# Patient Record
Sex: Female | Born: 1955 | Race: White | Hispanic: No | Marital: Married | Smoking: Former smoker
Health system: Southern US, Community
[De-identification: ages and names within clinical notes are randomized; demographics above are authoritative.]

## PROBLEM LIST (undated history)

## (undated) DIAGNOSIS — R638 Other symptoms and signs concerning food and fluid intake: Secondary | ICD-10-CM

## (undated) DIAGNOSIS — R51 Headache: Secondary | ICD-10-CM

## (undated) DIAGNOSIS — Z780 Asymptomatic menopausal state: Secondary | ICD-10-CM

## (undated) DIAGNOSIS — K5730 Diverticulosis of large intestine without perforation or abscess without bleeding: Secondary | ICD-10-CM

## (undated) DIAGNOSIS — R519 Headache, unspecified: Secondary | ICD-10-CM

## (undated) DIAGNOSIS — Z8669 Personal history of other diseases of the nervous system and sense organs: Secondary | ICD-10-CM

## (undated) DIAGNOSIS — G4700 Insomnia, unspecified: Secondary | ICD-10-CM

## (undated) DIAGNOSIS — N809 Endometriosis, unspecified: Secondary | ICD-10-CM

## (undated) HISTORY — DX: Insomnia, unspecified: G47.00

## (undated) HISTORY — DX: Endometriosis, unspecified: N80.9

## (undated) HISTORY — DX: Diverticulosis of large intestine without perforation or abscess without bleeding: K57.30

## (undated) HISTORY — DX: Headache, unspecified: R51.9

## (undated) HISTORY — DX: Asymptomatic menopausal state: Z78.0

## (undated) HISTORY — DX: Headache: R51

## (undated) HISTORY — DX: Personal history of other diseases of the nervous system and sense organs: Z86.69

## (undated) HISTORY — DX: Other symptoms and signs concerning food and fluid intake: R63.8

---

## 2007-04-14 ENCOUNTER — Encounter: Payer: Self-pay | Admitting: Podiatry

## 2007-04-24 ENCOUNTER — Encounter: Payer: Self-pay | Admitting: Podiatry

## 2007-05-25 ENCOUNTER — Encounter: Payer: Self-pay | Admitting: Podiatry

## 2009-01-12 LAB — HM COLONOSCOPY

## 2011-04-15 ENCOUNTER — Ambulatory Visit: Payer: Self-pay | Admitting: Internal Medicine

## 2011-11-12 ENCOUNTER — Emergency Department: Payer: Self-pay | Admitting: Emergency Medicine

## 2011-11-12 LAB — CBC WITH DIFFERENTIAL/PLATELET
Basophil %: 0.4 %
Eosinophil #: 0.3 10*3/uL (ref 0.0–0.7)
Eosinophil %: 2.4 %
HCT: 42.2 % (ref 35.0–47.0)
HGB: 14 g/dL (ref 12.0–16.0)
Lymphocyte %: 10.9 %
MCV: 89 fL (ref 80–100)
Monocyte #: 1.6 10*3/uL — ABNORMAL HIGH (ref 0.0–0.7)
Neutrophil #: 8.4 10*3/uL — ABNORMAL HIGH (ref 1.4–6.5)
Platelet: 275 10*3/uL (ref 150–440)
RBC: 4.77 10*6/uL (ref 3.80–5.20)
RDW: 13.4 % (ref 11.5–14.5)
WBC: 11.5 10*3/uL — ABNORMAL HIGH (ref 3.6–11.0)

## 2011-11-12 LAB — COMPREHENSIVE METABOLIC PANEL
BUN: 14 mg/dL (ref 7–18)
Bilirubin,Total: 0.3 mg/dL (ref 0.2–1.0)
Calcium, Total: 9.3 mg/dL (ref 8.5–10.1)
Chloride: 102 mmol/L (ref 98–107)
Co2: 23 mmol/L (ref 21–32)
EGFR (African American): >60
EGFR (Non-African Amer.): 57 — ABNORMAL LOW
Glucose: 110 mg/dL — ABNORMAL HIGH (ref 65–99)
Osmolality: 279 (ref 275–301)
Potassium: 3.4 mmol/L — ABNORMAL LOW (ref 3.5–5.1)
SGPT (ALT): 36 U/L

## 2011-11-12 LAB — LIPASE, BLOOD: Lipase: 148 U/L (ref 73–393)

## 2012-09-10 ENCOUNTER — Emergency Department: Payer: Self-pay | Admitting: Emergency Medicine

## 2014-01-18 LAB — HM PAP SMEAR: HM Pap smear: NEGATIVE

## 2014-01-25 LAB — HM MAMMOGRAPHY

## 2014-01-28 ENCOUNTER — Ambulatory Visit: Payer: Self-pay | Admitting: Obstetrics and Gynecology

## 2015-03-21 ENCOUNTER — Ambulatory Visit (INDEPENDENT_AMBULATORY_CARE_PROVIDER_SITE_OTHER): Payer: BC Managed Care – PPO | Admitting: Obstetrics and Gynecology

## 2015-03-21 ENCOUNTER — Ambulatory Visit: Payer: BC Managed Care – PPO | Admitting: Obstetrics and Gynecology

## 2015-03-21 ENCOUNTER — Other Ambulatory Visit: Payer: BC Managed Care – PPO

## 2015-03-21 ENCOUNTER — Encounter: Payer: Self-pay | Admitting: Obstetrics and Gynecology

## 2015-03-21 VITALS — BP 165/78 | HR 81 | Ht 65.0 in | Wt 165.1 lb

## 2015-03-21 DIAGNOSIS — R030 Elevated blood-pressure reading, without diagnosis of hypertension: Secondary | ICD-10-CM | POA: Diagnosis not present

## 2015-03-21 DIAGNOSIS — R102 Pelvic and perineal pain: Secondary | ICD-10-CM | POA: Insufficient documentation

## 2015-03-21 DIAGNOSIS — K5790 Diverticulosis of intestine, part unspecified, without perforation or abscess without bleeding: Secondary | ICD-10-CM | POA: Insufficient documentation

## 2015-03-21 DIAGNOSIS — G43909 Migraine, unspecified, not intractable, without status migrainosus: Secondary | ICD-10-CM | POA: Insufficient documentation

## 2015-03-21 DIAGNOSIS — Z1211 Encounter for screening for malignant neoplasm of colon: Secondary | ICD-10-CM

## 2015-03-21 DIAGNOSIS — N809 Endometriosis, unspecified: Secondary | ICD-10-CM | POA: Diagnosis not present

## 2015-03-21 DIAGNOSIS — G4700 Insomnia, unspecified: Secondary | ICD-10-CM | POA: Insufficient documentation

## 2015-03-21 DIAGNOSIS — Z833 Family history of diabetes mellitus: Secondary | ICD-10-CM

## 2015-03-21 DIAGNOSIS — Z780 Asymptomatic menopausal state: Secondary | ICD-10-CM | POA: Diagnosis not present

## 2015-03-21 DIAGNOSIS — Z1239 Encounter for other screening for malignant neoplasm of breast: Secondary | ICD-10-CM | POA: Diagnosis not present

## 2015-03-21 DIAGNOSIS — R638 Other symptoms and signs concerning food and fluid intake: Secondary | ICD-10-CM | POA: Diagnosis not present

## 2015-03-21 DIAGNOSIS — R1031 Right lower quadrant pain: Secondary | ICD-10-CM | POA: Diagnosis not present

## 2015-03-21 DIAGNOSIS — Z01419 Encounter for gynecological examination (general) (routine) without abnormal findings: Secondary | ICD-10-CM

## 2015-03-21 DIAGNOSIS — E7800 Pure hypercholesterolemia, unspecified: Secondary | ICD-10-CM

## 2015-03-21 DIAGNOSIS — IMO0001 Reserved for inherently not codable concepts without codable children: Secondary | ICD-10-CM

## 2015-03-21 NOTE — Progress Notes (Signed)
Patient ID: Kendra RombergLaura R Farino, female   DOB: 08-Aug-1956, 59 y.o.   MRN: [id number] ANNUAL PREVENTATIVE CARE GYN  ENCOUNTER NOTE  Subjective:       Kendra Adkins is a 59 y.o., white female, married, P2002, postmenopausal, here for a routine annual gynecologic exam.  Current complaints: 1.  Annual visit 2. Right pelvic pain - burning sensation, worsens with movement, increased frequency x 2 weeks PMH for endometriosis, diverticula, and headache. No hormonal supplementation. Negative family history for ovarian cancer.  Has cut sugar out of diet and lowered fat intake - lost 25 lbs in 6 months. Headaches have improved since starting new diet.    Gynecologic History No LMP recorded. Patient is postmenopausal. Contraception: postmenopausal Last Pap: 01/18/2014. Results were: negative/negative hpv Last mammogram: 01/28/2014. Results were: [facility] 1  Obstetric History OB History  No data available    Past Medical History  Diagnosis Date  . Endometriosis   . Head ache   . Diverticula of colon   . Menopause   . Increased BMI   . History of migraine headaches   . Insomnia     History reviewed. No pertinent past surgical history.  Current Outpatient Prescriptions on File Prior to Visit  Medication Sig Dispense Refill  . amitriptyline (ELAVIL) 10 MG tablet Take 10 mg by mouth at bedtime.    . butalbital-acetaminophen-caffeine (FIORICET, ESGIC) 50-325-40 MG per tablet Take 1 tablet by mouth 2 (two) times daily as needed for headache.     No current facility-administered medications on file prior to visit.    Allergies  Allergen Reactions  . Biaxin [Clarithromycin]     blisters    History   Social History  . Marital Status: Married    Spouse Name: N/A  . Number of Children: N/A  . Years of Education: N/A   Occupational History  . Geologist, engineeringteacher assistant     Social History Main Topics  . Smoking status: Former Games developermoker  . Smokeless tobacco: Never Used  . Alcohol Use: 0.0 oz/week    0  Standard drinks or equivalent per week  . Drug Use: No  . Sexual Activity:    Partners: Male   Other Topics Concern  . Not on file   Social History Narrative    History reviewed. No pertinent family history.  The following portions of the patient's history were reviewed and updated as appropriate: allergies, current medications, past family history, past medical history, past social history, past surgical history and problem list.  Review of Systems ROS Review of Systems - General ROS: negative for - chills, fatigue, fever, hot flashes, night sweats, weight gain or weight loss Psychological ROS: negative for - anxiety, decreased libido, depression, mood swings, physical abuse or sexual abuse Ophthalmic ROS: negative for - blurry vision, eye pain or loss of vision ENT ROS: negative for - headaches, hearing change, visual changes or vocal changes Allergy and Immunology ROS: negative for - hives, itchy/watery eyes or seasonal allergies Hematological and Lymphatic ROS: negative for - bleeding problems, bruising, swollen lymph nodes or weight loss Endocrine ROS: negative for - galactorrhea, hair pattern changes, hot flashes, malaise/lethargy, mood swings, palpitations, polydipsia/polyuria, skin changes, temperature intolerance or unexpected weight changes Breast ROS: negative for - new or changing breast lumps or nipple discharge Respiratory ROS: negative for - cough or shortness of breath Cardiovascular ROS: negative for - chest pain, irregular heartbeat, palpitations or shortness of breath Gastrointestinal ROS: no abdominal pain, change in bowel habits, or black or bloody  stools Genito-Urinary ROS: Positive - right pelvic pain, no dysuria, trouble voiding, or hematuria Musculoskeletal ROS: negative for - joint pain or joint stiffness Neurological ROS: negative for - bowel and bladder control changes Dermatological ROS: negative for rash and skin lesion changes   Objective:   BP 165/78  mmHg  Pulse 81  Ht  (1.651 m)  Wt 165 lb 2 oz (74.9 kg)  BMI 27.48 kg/m2 CONSTITUTIONAL: Well-developed, well-nourished female in no acute distress.  PSYCHIATRIC: Normal mood and affect. Normal behavior. Normal judgment and thought content. NEUROLGIC: Alert and oriented to person, place, and time. Normal muscle tone coordination. No cranial nerve deficit noted. HENT:  Normocephalic, atraumatic, External right and left ear normal. Oropharynx is clear and moist EYES: Conjunctivae and EOM are normal. Pupils are equal, round, and reactive to light. No scleral icterus.  NECK: Normal range of motion, supple, no masses.  Normal thyroid.  SKIN: Skin is warm and dry. No rash noted. Not diaphoretic. No erythema. No pallor. CARDIOVASCULAR: Normal heart rate noted, regular rhythm, no murmur. RESPIRATORY: Clear to auscultation bilaterally. Effort and breath sounds normal, no problems with respiration noted. BREASTS: Symmetric in size. No masses, skin changes, nipple drainage, or lymphadenopathy. ABDOMEN: Soft, normal bowel sounds, no distention noted.  No tenderness, rebound or guarding.  BLADDER: Normal PELVIC:  External Genitalia: Normal  BUS: Normal  Vagina: Mild atrophy on vaginal walls  Cervix: Mild atrophic changes  Uterus: Normal  Adnexa: Normal  RV: External Exam NormaI, No Rectal Masses and Normal Sphincter tone  MUSCULOSKELETAL: Normal range of motion. No tenderness.  No cyanosis, clubbing, or edema.  2+ distal pulses. LYMPHATIC: No Axillary, Supraclavicular, or Inguinal Adenopathy.    Assessment:   Annual gynecologic examination 59 y.o. Contraception: postmenopausal BMI: 27  (DOWN from 30 last year) High blood pressure reading today: 165/78  Plan:  Pap: not needed until 2018 Mammogram: ordered Stool Guaiac Testing: ordered Labs: lipid panel, HbgA1c, FBS, TSH, Vitamin D, Renal panel, CBC Routine preventative health maintenance measures emphasized: Exercise/Diet/Weight  control, Tobacco Warnings, Alcohol/Substance use risks and Stress Management  Return to Clinic - 1 Year   Fenton Malling, LPN

## 2015-03-22 ENCOUNTER — Other Ambulatory Visit: Payer: Self-pay | Admitting: Obstetrics and Gynecology

## 2015-03-22 ENCOUNTER — Telehealth: Payer: Self-pay

## 2015-03-22 LAB — CBC WITH DIFFERENTIAL/PLATELET
Basophils Absolute: 0.1 10*3/uL (ref 0.0–0.2)
Basos: 1 %
EOS (ABSOLUTE): 1.1 10*3/uL — AB (ref 0.0–0.4)
Eos: 11 %
HEMATOCRIT: 40.6 % (ref 34.0–46.6)
HEMOGLOBIN: 12.6 g/dL (ref 11.1–15.9)
IMMATURE GRANS (ABS): 0 10*3/uL (ref 0.0–0.1)
Immature Granulocytes: 0 %
LYMPHS: 18 %
Lymphocytes Absolute: 1.9 10*3/uL (ref 0.7–3.1)
MCH: 28.1 pg (ref 26.6–33.0)
MCHC: 31 g/dL — AB (ref 31.5–35.7)
MCV: 91 fL (ref 79–97)
MONOCYTES: 9 %
MONOS ABS: 0.9 10*3/uL (ref 0.1–0.9)
NEUTROS ABS: 6.2 10*3/uL (ref 1.4–7.0)
Neutrophils: 61 %
Platelets: 468 10*3/uL — ABNORMAL HIGH (ref 150–379)
RBC: 4.48 x10E6/uL (ref 3.77–5.28)
RDW: 13.3 % (ref 12.3–15.4)
WBC: 10.1 10*3/uL (ref 3.4–10.8)

## 2015-03-22 LAB — TSH: TSH: 2.11 u[IU]/mL (ref 0.450–4.500)

## 2015-03-22 LAB — HEMOGLOBIN A1C
Est. average glucose Bld gHb Est-mCnc: 117 mg/dL
Hgb A1c MFr Bld: 5.7 % — ABNORMAL HIGH (ref 4.8–5.6)

## 2015-03-22 LAB — LIPID PANEL
CHOLESTEROL TOTAL: 212 mg/dL — AB (ref 100–199)
Chol/HDL Ratio: 3.9 ratio units (ref 0.0–4.4)
HDL: 55 mg/dL (ref >39)
LDL Calculated: 120 mg/dL — ABNORMAL HIGH (ref 0–99)
Triglycerides: 185 mg/dL — ABNORMAL HIGH (ref 0–149)
VLDL Cholesterol Cal: 37 mg/dL (ref 5–40)

## 2015-03-22 LAB — RENAL FUNCTION PANEL: Glucose, Plasma: 93 mg/dL (ref 65–99)

## 2015-03-22 NOTE — Telephone Encounter (Signed)
-----   Message from Herold HarmsMartin A Defrancesco, MD sent at 03/22/2015  2:02 PM EDT ----- Please notify - Abnormal Labs Lipd 1 , HGBA1C elevated. Diet/exercise/repeat in 6 months

## 2015-03-22 NOTE — Telephone Encounter (Signed)
Pt aware. Will place on recall schedule to repeat labs in 6 months.

## 2015-03-23 NOTE — Telephone Encounter (Signed)
DONE

## 2015-03-24 ENCOUNTER — Ambulatory Visit: Payer: BC Managed Care – PPO

## 2015-03-24 DIAGNOSIS — R1031 Right lower quadrant pain: Secondary | ICD-10-CM | POA: Diagnosis not present

## 2015-03-27 LAB — VITAMIN D 1,25 DIHYDROXY
Vitamin D 1, 25 (OH)2 Total: 65 pg/mL
Vitamin D2 1, 25 (OH)2: <10 pg/mL
Vitamin D3 1, 25 (OH)2: 65 pg/mL

## 2015-03-28 ENCOUNTER — Encounter: Payer: Self-pay | Admitting: Obstetrics and Gynecology

## 2015-03-29 LAB — FECAL OCCULT BLOOD, IMMUNOCHEMICAL: Fecal Occult Bld: POSITIVE — AB

## 2015-03-30 ENCOUNTER — Encounter: Payer: Self-pay | Admitting: Nurse Practitioner

## 2015-03-30 ENCOUNTER — Telehealth: Payer: Self-pay

## 2015-03-30 DIAGNOSIS — R195 Other fecal abnormalities: Secondary | ICD-10-CM

## 2015-03-30 NOTE — Telephone Encounter (Signed)
-----   Message from Herold HarmsMartin A Defrancesco, MD sent at 03/29/2015  3:56 PM EDT ----- Please notify - Abnormal Labs Recommend GI referral

## 2015-03-30 NOTE — Telephone Encounter (Signed)
Pt aware. She seems to think the blood is coming from her hemorrhoids. Advised mad would still like her to  See GI to make sure. Referral ordered.

## 2015-04-12 ENCOUNTER — Ambulatory Visit: Payer: Self-pay | Admitting: Nurse Practitioner

## 2015-07-11 ENCOUNTER — Other Ambulatory Visit: Payer: Self-pay

## 2015-07-11 MED ORDER — BUTALBITAL-APAP-CAFFEINE 50-325-40 MG PO TABS: 1.0000 | ORAL_TABLET | Freq: Two times a day (BID) | ORAL | Status: DC | PRN

## 2015-07-11 NOTE — Telephone Encounter (Signed)
Per DC med printed and faxed to [facility] [place].

## 2016-03-21 ENCOUNTER — Encounter: Payer: BC Managed Care – PPO | Admitting: Obstetrics and Gynecology

## 2016-03-27 ENCOUNTER — Encounter: Payer: Self-pay | Admitting: Obstetrics and Gynecology

## 2016-03-27 ENCOUNTER — Ambulatory Visit (INDEPENDENT_AMBULATORY_CARE_PROVIDER_SITE_OTHER): Payer: BC Managed Care – PPO | Admitting: Obstetrics and Gynecology

## 2016-03-27 VITALS — BP 157/86 | HR 69 | Ht 64.0 in | Wt 146.8 lb

## 2016-03-27 DIAGNOSIS — R030 Elevated blood-pressure reading, without diagnosis of hypertension: Secondary | ICD-10-CM

## 2016-03-27 DIAGNOSIS — IMO0001 Reserved for inherently not codable concepts without codable children: Secondary | ICD-10-CM

## 2016-03-27 DIAGNOSIS — Z780 Asymptomatic menopausal state: Secondary | ICD-10-CM | POA: Diagnosis not present

## 2016-03-27 DIAGNOSIS — Z01419 Encounter for gynecological examination (general) (routine) without abnormal findings: Secondary | ICD-10-CM

## 2016-03-27 DIAGNOSIS — Z1239 Encounter for other screening for malignant neoplasm of breast: Secondary | ICD-10-CM

## 2016-03-27 NOTE — Addendum Note (Signed)
Addended by: Sharon SellerEFRANCESCO, MARTIN on: 03/27/2016 03:13 PM   Modules accepted: Kipp BroodSmartSet

## 2016-03-27 NOTE — Patient Instructions (Signed)
1. No Pap smear is needed this year. 2. Mammogram is ordered 3. Stool guaiac cards testing for colon cancer screening are recommended but declined (last colonoscopy 2007-normal) 4. Continue with healthy eating and exercise 5. Screening lab work is done by primary care physician 6. Return in 1 year for physical

## 2016-03-27 NOTE — Progress Notes (Signed)
ANNUAL PREVENTATIVE CARE GYN  ENCOUNTER NOTE  Subjective:       Kendra Adkins is a 60 y.o. [id number] female here for a routine annual gynecologic exam.  Current complaints: 1.  Menopause  No major interval health issues. Patient continues to lose weight Weight [employer]. Bowel and bladder function are normal. Patient is due for colonoscopy but declines at this time; last colonoscopy was in 2007-normal.    Gynecologic History No LMP recorded. Patient is postmenopausal. Contraception: post menopausal status Last Pap: 12/2013 negative/negative Last mammogram: 01/28/2014 BI-RADS 1  Obstetric History OB History  Gravida Para Term Preterm AB SAB TAB Ectopic Multiple Living  2 2 2       2     # Outcome Date GA Lbr Len/2nd Weight Sex Delivery Anes PTL Lv  2 Term 1994   7 lb 8 oz (3.402 kg) M Vag-Spont   Y  1 Term 1991   7 lb 6 oz (3.345 kg) M Vag-Spont   Y      Past Medical History  Diagnosis Date  . Endometriosis   . Head ache   . Diverticula of colon   . Menopause   . Increased BMI   . History of migraine headaches   . Insomnia     History reviewed. No pertinent past surgical history.  Current Outpatient Prescriptions on File Prior to Visit  Medication Sig Dispense Refill  . amitriptyline (ELAVIL) 10 MG tablet Take 10 mg by mouth at bedtime.    . fluticasone (FLONASE) 50 MCG/ACT nasal spray Place into the nose.     No current facility-administered medications on file prior to visit.    Allergies  Allergen Reactions  . Biaxin [Clarithromycin]     blisters    Social History   Social History  . Marital Status: Married    Spouse Name: N/A  . Number of Children: N/A  . Years of Education: N/A   Occupational History  . Geologist, engineeringteacher assistant     Social History Main Topics  . Smoking status: Former Games developermoker  . Smokeless tobacco: Never Used  . Alcohol Use: 0.0 oz/week    0 Standard drinks or equivalent per week     Comment: rare  . Drug Use: No  . Sexual Activity:   Partners: Male    Birth Control/ Protection: Post-menopausal   Other Topics Concern  . Not on file   Social History Narrative    Family History  Problem Relation Age of Onset  . Diabetes Mother   . Congestive Heart Failure Mother   . Diabetes Brother   . Cancer Neg Hx     The following portions of the patient's history were reviewed and updated as appropriate: allergies, current medications, past family history, past medical history, past social history, past surgical history and problem list.  Review of Systems ROS Review of Systems - General ROS: negative for - chills, fatigue, fever, hot flashes, night sweats, weight gain or weight loss Psychological ROS: negative for - anxiety, decreased libido, depression, mood swings, physical abuse or sexual abuse Ophthalmic ROS: negative for - blurry vision, eye pain or loss of vision ENT ROS: negative for - headaches, hearing change, visual changes or vocal changes Allergy and Immunology ROS: negative for - hives, itchy/watery eyes or seasonal allergies Hematological and Lymphatic ROS: negative for - bleeding problems, bruising, swollen lymph nodes or weight loss Endocrine ROS: negative for - galactorrhea, hair pattern changes, hot flashes, malaise/lethargy, mood swings, palpitations, polydipsia/polyuria, skin changes, temperature intolerance  or unexpected weight changes Breast ROS: negative for - new or changing breast lumps or nipple discharge Respiratory ROS: negative for - cough or shortness of breath Cardiovascular ROS: negative for - chest pain, irregular heartbeat, palpitations or shortness of breath Gastrointestinal ROS: no abdominal pain, change in bowel habits, or black or bloody stools Genito-Urinary ROS: no dysuria, trouble voiding, or hematuria Musculoskeletal ROS: negative for - joint pain or joint stiffness Neurological ROS: negative for - bowel and bladder control changes Dermatological ROS: negative for rash and skin  lesion changes   Objective:   BP 157/86 mmHg  Pulse 69  Ht 5\' 4"  (1.626 m)  Wt 146 lb 12.8 oz (66.588 kg)  BMI 25.19 kg/m2 CONSTITUTIONAL: Well-developed, well-nourished female in no acute distress.  PSYCHIATRIC: Normal mood and affect. Normal behavior. Normal judgment and thought content. NEUROLGIC: Alert and oriented to person, place, and time. Normal muscle tone coordination. No cranial nerve deficit noted. HENT:  Normocephalic, atraumatic, External right and left ear normal. Oropharynx is clear and moist EYES: Conjunctivae and EOM are normal. Pupils are equal, round, and reactive to light. No scleral icterus.  NECK: Normal range of motion, supple, no masses.  Normal thyroid.  SKIN: Skin is warm and dry. No rash noted. Not diaphoretic. No erythema. No pallor. CARDIOVASCULAR: Normal heart rate noted, regular rhythm, no murmur. RESPIRATORY: Clear to auscultation bilaterally. Effort and breath sounds normal, no problems with respiration noted. BREASTS: Symmetric in size. No masses, skin changes, nipple drainage, or lymphadenopathy. ABDOMEN: Soft, normal bowel sounds, no distention noted.  No tenderness, rebound or guarding.  BLADDER: Normal PELVIC:  External Genitalia: Normal  BUS: Normal  Vagina: Normal; mild atrophic changes  Cervix: Normal; no lesions  Uterus: Normal; midplane, normal size and shape, mobile, nontender  Adnexa: Normal  RV: External Exam NormaI, No Rectal Masses and Normal Sphincter tone  MUSCULOSKELETAL: Normal range of motion. No tenderness.  No cyanosis, clubbing, or edema.  2+ distal pulses. LYMPHATIC: No Axillary, Supraclavicular, or Inguinal Adenopathy.    Assessment:   Annual gynecologic examination 60 y.o. Contraception: post menopausal status Normal BMI Problem List Items Addressed This Visit      Other   Menopause    Other Visit Diagnoses    Well woman exam with routine gynecological exam    -  Primary    Screening for breast cancer         Relevant Orders    MM DIGITAL SCREENING BILATERAL    Elevated blood pressure           Plan:  Pap: Not needed and Not done Mammogram: Ordered Stool Guaiac Testing:  Recommended but declined colonoscopy 2007-normal Labs: Per primary care Routine preventative health maintenance measures emphasized: Exercise/Diet/Weight control, Tobacco Warnings and Alcohol/Substance use risks Return to Clinic - 1 Year   Herold HarmsMartin A Shaundra Fullam, MD   Note: This dictation was prepared with Dragon dictation along with smaller phrase technology. Any transcriptional errors that result from this process are unintentional.

## 2016-04-24 ENCOUNTER — Other Ambulatory Visit: Payer: Self-pay | Admitting: Obstetrics and Gynecology

## 2016-04-24 ENCOUNTER — Ambulatory Visit
Admission: RE | Admit: 2016-04-24 | Discharge: 2016-04-24 | Disposition: A | Discharge status: Home / Self Care | Payer: BC Managed Care – PPO | Source: Ambulatory Visit | Attending: Obstetrics and Gynecology | Admitting: Obstetrics and Gynecology

## 2016-04-24 DIAGNOSIS — Z1239 Encounter for other screening for malignant neoplasm of breast: Secondary | ICD-10-CM | POA: Insufficient documentation

## 2016-04-24 DIAGNOSIS — Z1231 Encounter for screening mammogram for malignant neoplasm of breast: Secondary | ICD-10-CM | POA: Insufficient documentation

## 2017-03-25 NOTE — Progress Notes (Signed)
ANNUAL PREVENTATIVE CARE GYN  ENCOUNTER NOTE  Subjective:       Kendra Adkins is a 61 y.o. [phone] female here for a routine annual gynecologic exam.  Current complaints: 1.  Menopause  No major interval health issues. Bowel and bladder function are normal. Patient is due for colonoscopy, last colonoscopy was in 2007-normal.    Gynecologic History No LMP recorded. Patient is postmenopausal. Contraception: post menopausal status Last Pap: 12/2013 negative/negative Last mammogram:04/2016  BI-RADS 1  Obstetric History OB History  Gravida Para Term Preterm AB Living  2 2 2     2   SAB TAB Ectopic Multiple Live Births          2    # Outcome Date GA Lbr Len/2nd Weight Sex Delivery Anes PTL Lv  2 Term 1994   7 lb 8 oz (3.402 kg) M Vag-Spont   LIV  1 Term 1991   7 lb 6 oz (3.345 kg) M Vag-Spont   LIV     Past Medical History:  Diagnosis Date  . Diverticula of colon   . Endometriosis   . Head ache   . History of migraine headaches   . Increased BMI   . Insomnia   . Menopause     No past surgical history on file.  Current Outpatient Prescriptions on File Prior to Visit  Medication Sig Dispense Refill  . amitriptyline (ELAVIL) 10 MG tablet Take 10 mg by mouth at bedtime.    . butalbital-aspirin-caffeine (FIORINAL) 50-325-40 MG capsule     . fluticasone (FLONASE) 50 MCG/ACT nasal spray Place into the nose.     No current facility-administered medications on file prior to visit.     Allergies  Allergen Reactions  . Biaxin [Clarithromycin]     blisters    Social History   Social History  . Marital status: Married    Spouse name: N/A  . Number of children: N/A  . Years of education: N/A   Occupational History  . Geologist, engineeringteacher assistant     Social History Main Topics  . Smoking status: Former Games developermoker  . Smokeless tobacco: Never Used  . Alcohol use 0.0 oz/week     Comment: rare  . Drug use: No  . Sexual activity: Yes    Partners: Male    Birth control/ protection:  Post-menopausal   Other Topics Concern  . Not on file   Social History Narrative  . No narrative on file    Family History  Problem Relation Age of Onset  . Diabetes Mother   . Congestive Heart Failure Mother   . Diabetes Brother   . Breast cancer Cousin        mat cousin  . Cancer Neg Hx     The following portions of the patient's history were reviewed and updated as appropriate: allergies, current medications, past family history, past medical history, past social history, past surgical history and problem list.  Review of Systems Review of Systems  Constitutional: Negative.        No vasomotor symptoms  HENT: Negative.   Eyes: Negative.   Respiratory: Negative.   Cardiovascular: Negative.   Gastrointestinal: Negative.   Genitourinary: Negative.        Mild vaginal dryness, not affecting lifestyle  Musculoskeletal: Negative.   Skin: Negative.   Neurological: Negative.   Endo/Heme/Allergies: Negative.   Psychiatric/Behavioral: Negative.      Objective:   BP (!) 168/87   Pulse 91   Ht 5' 4.5" (1.638  m)   Wt 151 lb 11.2 oz (68.8 kg)   BMI 25.64 kg/m  CONSTITUTIONAL: Well-developed, well-nourished female in no acute distress.  PSYCHIATRIC: Normal mood and affect. Normal behavior. Normal judgment and thought content. NEUROLGIC: Alert and oriented to person, place, and time. Normal muscle tone coordination. No cranial nerve deficit noted. HENT:  Normocephalic, atraumatic, External right and left ear normal. Oropharynx is clear and moist EYES: Conjunctivae and EOM are normal. Pupils are equal, round, and reactive to light. No scleral icterus.  NECK: Normal range of motion, supple, no masses.  Normal thyroid.  SKIN: Skin is warm and dry. No rash noted. Not diaphoretic. No erythema. No pallor. CARDIOVASCULAR: Normal heart rate noted, regular rhythm, no murmur. RESPIRATORY: Clear to auscultation bilaterally. Effort and breath sounds normal, no problems with respiration  noted. BREASTS: Symmetric in size. No masses, skin changes, nipple drainage, or lymphadenopathy. ABDOMEN: Soft, normal bowel sounds, no distention noted.  No tenderness, rebound or guarding.  BLADDER: Normal PELVIC:  External Genitalia: Normal  BUS: Normal  Vagina: Normal; moderate atrophic changes  Cervix: Normal; no lesions; Pap smear obtained  Uterus: Normal; midplane, normal size and shape, mobile, nontender  Adnexa: Normal  RV: External Exam NormaI, No Rectal Masses and Normal Sphincter tone  MUSCULOSKELETAL: Normal range of motion. No tenderness.  No cyanosis, clubbing, or edema.  2+ distal pulses. LYMPHATIC: No Axillary, Supraclavicular, or Inguinal Adenopathy.    Assessment:   Annual gynecologic examination 61 y.o. Contraception: post menopausal status Normal BMI Problem List Items Addressed This Visit    Menopause    Other Visit Diagnoses    Well woman exam with routine gynecological exam    -  Primary   Screening for breast cancer        Elevated blood pressure  Plan:  Pap: pap/hpv Mammogram: Ordered Stool Guaiac Testing:  Recommended but declined colonoscopy 2007-normal Labs: Per primary care Routine preventative health maintenance measures emphasized: Exercise/Diet/Weight control, Tobacco Warnings and Alcohol/Substance use risks  Follow-up with Dr. Maryjane Hurter regarding elevated blood pressure Return to Clinic - 1 Year   Crystal Roscoe, CMA  Herold Harms, MD  Note: This dictation was prepared with Dragon dictation along with smaller phrase technology. Any transcriptional errors that result from this process are unintentional.

## 2017-04-01 ENCOUNTER — Encounter: Payer: Self-pay | Admitting: Obstetrics and Gynecology

## 2017-04-01 ENCOUNTER — Ambulatory Visit (INDEPENDENT_AMBULATORY_CARE_PROVIDER_SITE_OTHER): Payer: BC Managed Care – PPO | Admitting: Obstetrics and Gynecology

## 2017-04-01 ENCOUNTER — Encounter: Payer: BC Managed Care – PPO | Admitting: Obstetrics and Gynecology

## 2017-04-01 VITALS — BP 168/87 | HR 91 | Ht 64.5 in | Wt 151.7 lb

## 2017-04-01 DIAGNOSIS — R030 Elevated blood-pressure reading, without diagnosis of hypertension: Secondary | ICD-10-CM | POA: Diagnosis not present

## 2017-04-01 DIAGNOSIS — Z780 Asymptomatic menopausal state: Secondary | ICD-10-CM | POA: Diagnosis not present

## 2017-04-01 DIAGNOSIS — Z1211 Encounter for screening for malignant neoplasm of colon: Secondary | ICD-10-CM

## 2017-04-01 DIAGNOSIS — Z01419 Encounter for gynecological examination (general) (routine) without abnormal findings: Secondary | ICD-10-CM

## 2017-04-01 DIAGNOSIS — Z1231 Encounter for screening mammogram for malignant neoplasm of breast: Secondary | ICD-10-CM | POA: Diagnosis not present

## 2017-04-01 DIAGNOSIS — N809 Endometriosis, unspecified: Secondary | ICD-10-CM | POA: Diagnosis not present

## 2017-04-01 DIAGNOSIS — Z1239 Encounter for other screening for malignant neoplasm of breast: Secondary | ICD-10-CM

## 2017-04-01 NOTE — Patient Instructions (Signed)
1. Pap smear is done 2. Mammograms ordered 3. Stool guaiac cards are recommended, but declined by patient 4. Continue with calcium and vitamin D supplementation 5. Continue with healthy eating and exercise 6. Return in 1 year for annual exam 7. Follow-up with Dr. Ellison Kendra Adkins regarding elevated blood pressure   Health Maintenance for Postmenopausal Women Menopause is a normal process in which your reproductive ability comes to an end. This process happens gradually over a span of months to years, usually between the ages of 43 and 16. Menopause is complete when you have missed 12 consecutive menstrual periods. It is important to talk with your health care provider about some of the most common conditions that affect postmenopausal women, such as heart disease, cancer, and bone loss (osteoporosis). Adopting a healthy lifestyle and getting preventive care can help to promote your health and wellness. Those actions can also lower your chances of developing some of these common conditions. What should I know about menopause? During menopause, you may experience a number of symptoms, such as:  Moderate-to-severe hot flashes.  Night sweats.  Decrease in sex drive.  Mood swings.  Headaches.  Tiredness.  Irritability.  Memory problems.  Insomnia.  Choosing to treat or not to treat menopausal changes is an individual decision that you make with your health care provider. What should I know about hormone replacement therapy and supplements? Hormone therapy products are effective for treating symptoms that are associated with menopause, such as hot flashes and night sweats. Hormone replacement carries certain risks, especially as you become older. If you are thinking about using estrogen or estrogen with progestin treatments, discuss the benefits and risks with your health care provider. What should I know about heart disease and stroke? Heart disease, heart attack, and stroke become more  likely as you age. This may be due, in part, to the hormonal changes that your body experiences during menopause. These can affect how your body processes dietary fats, triglycerides, and cholesterol. Heart attack and stroke are both medical emergencies. There are many things that you can do to help prevent heart disease and stroke:  Have your blood pressure checked at least every 1-2 years. High blood pressure causes heart disease and increases the risk of stroke.  If you are 68-3 years old, ask your health care provider if you should take aspirin to prevent a heart attack or a stroke.  Do not use any tobacco products, including cigarettes, chewing tobacco, or electronic cigarettes. If you need help quitting, ask your health care provider.  It is important to eat a healthy diet and maintain a healthy weight. ? Be sure to include plenty of vegetables, fruits, low-fat dairy products, and lean protein. ? Avoid eating foods that are high in solid fats, added sugars, or salt (sodium).  Get regular exercise. This is one of the most important things that you can do for your health. ? Try to exercise for at least 150 minutes each week. The type of exercise that you do should increase your heart rate and make you sweat. This is known as moderate-intensity exercise. ? Try to do strengthening exercises at least twice each week. Do these in addition to the moderate-intensity exercise.  Know your numbers.Ask your health care provider to check your cholesterol and your blood glucose. Continue to have your blood tested as directed by your health care provider.  What should I know about cancer screening? There are several types of cancer. Take the following steps to reduce your risk and  to catch any cancer development as early as possible. Breast Cancer  Practice breast self-awareness. ? This means understanding how your breasts normally appear and feel. ? It also means doing regular breast self-exams.  Let your health care provider know about any changes, no matter how small.  If you are 59 or older, have a clinician do a breast exam (clinical breast exam or CBE) every year. Depending on your age, family history, and medical history, it may be recommended that you also have a yearly breast X-ray (mammogram).  If you have a family history of breast cancer, talk with your health care provider about genetic screening.  If you are at high risk for breast cancer, talk with your health care provider about having an MRI and a mammogram every year.  Breast cancer (BRCA) gene test is recommended for women who have family members with BRCA-related cancers. Results of the assessment will determine the need for genetic counseling and BRCA1 and for BRCA2 testing. BRCA-related cancers include these types: ? Breast. This occurs in males or females. ? Ovarian. ? Tubal. This may also be called fallopian tube cancer. ? Cancer of the abdominal or pelvic lining (peritoneal cancer). ? Prostate. ? Pancreatic.  Cervical, Uterine, and Ovarian Cancer Your health care provider may recommend that you be screened regularly for cancer of the pelvic organs. These include your ovaries, uterus, and vagina. This screening involves a pelvic exam, which includes checking for microscopic changes to the surface of your cervix (Pap test).  For women ages 21-65, health care providers may recommend a pelvic exam and a Pap test every three years. For women ages 46-65, they may recommend the Pap test and pelvic exam, combined with testing for human papilloma virus (HPV), every five years. Some types of HPV increase your risk of cervical cancer. Testing for HPV may also be done on women of any age who have unclear Pap test results.  Other health care providers may not recommend any screening for nonpregnant women who are considered low risk for pelvic cancer and have no symptoms. Ask your health care provider if a screening pelvic exam  is right for you.  If you have had past treatment for cervical cancer or a condition that could lead to cancer, you need Pap tests and screening for cancer for at least 20 years after your treatment. If Pap tests have been discontinued for you, your risk factors (such as having a new sexual partner) need to be reassessed to determine if you should start having screenings again. Some women have medical problems that increase the chance of getting cervical cancer. In these cases, your health care provider may recommend that you have screening and Pap tests more often.  If you have a family history of uterine cancer or ovarian cancer, talk with your health care provider about genetic screening.  If you have vaginal bleeding after reaching menopause, tell your health care provider.  There are currently no reliable tests available to screen for ovarian cancer.  Lung Cancer Lung cancer screening is recommended for adults 28-55 years old who are at high risk for lung cancer because of a history of smoking. A yearly low-dose CT scan of the lungs is recommended if you:  Currently smoke.  Have a history of at least 30 pack-years of smoking and you currently smoke or have quit within the past 15 years. A pack-year is smoking an average of one pack of cigarettes per day for one year.  Yearly screening should:  Continue until it has been 15 years since you quit.  Stop if you develop a health problem that would prevent you from having lung cancer treatment.  Colorectal Cancer  This type of cancer can be detected and can often be prevented.  Routine colorectal cancer screening usually begins at age 73 and continues through age 33.  If you have risk factors for colon cancer, your health care provider may recommend that you be screened at an earlier age.  If you have a family history of colorectal cancer, talk with your health care provider about genetic screening.  Your health care provider may also  recommend using home test kits to check for hidden blood in your stool.  A small camera at the end of a tube can be used to examine your colon directly (sigmoidoscopy or colonoscopy). This is done to check for the earliest forms of colorectal cancer.  Direct examination of the colon should be repeated every 5-10 years until age 57. However, if early forms of precancerous polyps or small growths are found or if you have a family history or genetic risk for colorectal cancer, you may need to be screened more often.  Skin Cancer  Check your skin from head to toe regularly.  Monitor any moles. Be sure to tell your health care provider: ? About any new moles or changes in moles, especially if there is a change in a mole's shape or color. ? If you have a mole that is larger than the size of a pencil eraser.  If any of your family members has a history of skin cancer, especially at a young age, talk with your health care provider about genetic screening.  Always use sunscreen. Apply sunscreen liberally and repeatedly throughout the day.  Whenever you are outside, protect yourself by wearing long sleeves, pants, a wide-brimmed hat, and sunglasses.  What should I know about osteoporosis? Osteoporosis is a condition in which bone destruction happens more quickly than new bone creation. After menopause, you may be at an increased risk for osteoporosis. To help prevent osteoporosis or the bone fractures that can happen because of osteoporosis, the following is recommended:  If you are 21-2 years old, get at least 1,000 mg of calcium and at least 600 mg of vitamin D per day.  If you are older than age 88 but younger than age 6, get at least 1,200 mg of calcium and at least 600 mg of vitamin D per day.  If you are older than age 34, get at least 1,200 mg of calcium and at least 800 mg of vitamin D per day.  Smoking and excessive alcohol intake increase the risk of osteoporosis. Eat foods that are  rich in calcium and vitamin D, and do weight-bearing exercises several times each week as directed by your health care provider. What should I know about how menopause affects my mental health? Depression may occur at any age, but it is more common as you become older. Common symptoms of depression include:  Low or sad mood.  Changes in sleep patterns.  Changes in appetite or eating patterns.  Feeling an overall lack of motivation or enjoyment of activities that you previously enjoyed.  Frequent crying spells.  Talk with your health care provider if you think that you are experiencing depression. What should I know about immunizations? It is important that you get and maintain your immunizations. These include:  Tetanus, diphtheria, and pertussis (Tdap) booster vaccine.  Influenza every year before the  flu season begins.  Pneumonia vaccine.  Shingles vaccine.  Your health care provider may also recommend other immunizations. This information is not intended to replace advice given to you by your health care provider. Make sure you discuss any questions you have with your health care provider. Document Released: 11/01/2005 Document Revised: 03/29/2016 Document Reviewed: 06/13/2015 Elsevier Interactive Patient Education  2018 [employer].

## 2017-04-03 LAB — PAP IG AND HPV HIGH-RISK
HPV, HIGH-RISK: NEGATIVE
PAP SMEAR COMMENT: 0

## 2018-04-02 ENCOUNTER — Encounter: Payer: BC Managed Care – PPO | Admitting: Obstetrics and Gynecology

## 2018-06-01 ENCOUNTER — Emergency Department: Payer: BC Managed Care – PPO

## 2018-06-01 ENCOUNTER — Other Ambulatory Visit: Payer: Self-pay

## 2018-06-01 ENCOUNTER — Inpatient Hospital Stay
Admission: EM | Admit: 2018-06-01 | Discharge: 2018-06-05 | DRG: 418 | Disposition: A | Discharge status: Home / Self Care | Payer: BC Managed Care – PPO | Attending: Internal Medicine | Admitting: Internal Medicine

## 2018-06-01 ENCOUNTER — Inpatient Hospital Stay: Payer: BC Managed Care – PPO

## 2018-06-01 DIAGNOSIS — K8012 Calculus of gallbladder with acute and chronic cholecystitis without obstruction: Secondary | ICD-10-CM | POA: Diagnosis present

## 2018-06-01 DIAGNOSIS — F329 Major depressive disorder, single episode, unspecified: Secondary | ICD-10-CM | POA: Diagnosis present

## 2018-06-01 DIAGNOSIS — Z87891 Personal history of nicotine dependence: Secondary | ICD-10-CM

## 2018-06-01 DIAGNOSIS — E663 Overweight: Secondary | ICD-10-CM | POA: Diagnosis present

## 2018-06-01 DIAGNOSIS — K851 Biliary acute pancreatitis without necrosis or infection: Principal | ICD-10-CM

## 2018-06-01 DIAGNOSIS — R740 Nonspecific elevation of levels of transaminase and lactic acid dehydrogenase [LDH]: Secondary | ICD-10-CM

## 2018-06-01 DIAGNOSIS — Z419 Encounter for procedure for purposes other than remedying health state, unspecified: Secondary | ICD-10-CM

## 2018-06-01 DIAGNOSIS — K8050 Calculus of bile duct without cholangitis or cholecystitis without obstruction: Secondary | ICD-10-CM

## 2018-06-01 DIAGNOSIS — Z23 Encounter for immunization: Secondary | ICD-10-CM

## 2018-06-01 DIAGNOSIS — I10 Essential (primary) hypertension: Secondary | ICD-10-CM | POA: Diagnosis present

## 2018-06-01 DIAGNOSIS — K8000 Calculus of gallbladder with acute cholecystitis without obstruction: Secondary | ICD-10-CM | POA: Diagnosis not present

## 2018-06-01 DIAGNOSIS — R7401 Elevation of levels of liver transaminase levels: Secondary | ICD-10-CM | POA: Diagnosis present

## 2018-06-01 DIAGNOSIS — Z6827 Body mass index (BMI) 27.0-27.9, adult: Secondary | ICD-10-CM | POA: Diagnosis not present

## 2018-06-01 DIAGNOSIS — R079 Chest pain, unspecified: Secondary | ICD-10-CM | POA: Diagnosis present

## 2018-06-01 DIAGNOSIS — R52 Pain, unspecified: Secondary | ICD-10-CM

## 2018-06-01 DIAGNOSIS — E876 Hypokalemia: Secondary | ICD-10-CM | POA: Diagnosis not present

## 2018-06-01 DIAGNOSIS — G43909 Migraine, unspecified, not intractable, without status migrainosus: Secondary | ICD-10-CM | POA: Diagnosis present

## 2018-06-01 LAB — HEPATIC FUNCTION PANEL
ALBUMIN: 3.7 g/dL (ref 3.5–5.0)
ALK PHOS: 269 U/L — AB (ref 38–126)
ALT: 479 U/L — ABNORMAL HIGH (ref 0–44)
ALT: 592 U/L — ABNORMAL HIGH (ref 0–44)
AST: 835 U/L — AB (ref 15–41)
AST: 701 U/L — ABNORMAL HIGH (ref 15–41)
Albumin: 4 g/dL (ref 3.5–5.0)
Alkaline Phosphatase: 269 U/L — ABNORMAL HIGH (ref 38–126)
BILIRUBIN TOTAL: 1.8 mg/dL — AB (ref 0.3–1.2)
Bilirubin, Direct: 1.1 mg/dL — ABNORMAL HIGH (ref 0.0–0.2)
Bilirubin, Direct: 1.8 mg/dL — ABNORMAL HIGH (ref 0.0–0.2)
Indirect Bilirubin: 0.7 mg/dL (ref 0.3–0.9)
Indirect Bilirubin: 1.2 mg/dL — ABNORMAL HIGH (ref 0.3–0.9)
Total Bilirubin: 3 mg/dL — ABNORMAL HIGH (ref 0.3–1.2)
Total Protein: 7.2 g/dL (ref 6.5–8.1)
Total Protein: 6.8 g/dL (ref 6.5–8.1)

## 2018-06-01 LAB — BASIC METABOLIC PANEL
ANION GAP: 8 (ref 5–15)
BUN: 16 mg/dL (ref 8–23)
CHLORIDE: 105 mmol/L (ref 98–111)
CO2: 26 mmol/L (ref 22–32)
Calcium: 9.6 mg/dL (ref 8.9–10.3)
Creatinine, Ser: 0.99 mg/dL (ref 0.44–1.00)
GFR calc non Af Amer: 59 mL/min — ABNORMAL LOW (ref >60)
Glucose, Bld: 132 mg/dL — ABNORMAL HIGH (ref 70–99)
POTASSIUM: 4.2 mmol/L (ref 3.5–5.1)
SODIUM: 139 mmol/L (ref 135–145)

## 2018-06-01 LAB — TSH: TSH: 2.248 u[IU]/mL (ref 0.350–4.500)

## 2018-06-01 LAB — CBC
HEMATOCRIT: 35.2 % (ref 35.0–47.0)
Hemoglobin: 11.7 g/dL — ABNORMAL LOW (ref 12.0–16.0)
MCH: 27.8 pg (ref 26.0–34.0)
MCHC: 33.1 g/dL (ref 32.0–36.0)
MCV: 84.1 fL (ref 80.0–100.0)
Platelets: 373 10*3/uL (ref 150–440)
RBC: 4.19 MIL/uL (ref 3.80–5.20)
RDW: 15.5 % — ABNORMAL HIGH (ref 11.5–14.5)
WBC: 7.4 10*3/uL (ref 3.6–11.0)

## 2018-06-01 LAB — TROPONIN I: Troponin I: <0.03 ng/mL (ref <0.03)

## 2018-06-01 LAB — LIPASE, BLOOD: Lipase: 75 U/L — ABNORMAL HIGH (ref 11–51)

## 2018-06-01 LAB — GAMMA GT: GGT: 371 U/L — ABNORMAL HIGH (ref 7–50)

## 2018-06-01 MED ORDER — PANTOPRAZOLE SODIUM 40 MG PO TBEC
40.0000 mg | DELAYED_RELEASE_TABLET | Freq: Every day | ORAL | Status: DC
  Administered 2018-06-01 – 2018-06-05 (×5): 40 mg via ORAL
  Filled 2018-06-01 (×5): qty 1

## 2018-06-01 MED ORDER — LISINOPRIL 10 MG PO TABS
10.0000 mg | ORAL_TABLET | Freq: Every day | ORAL | Status: DC
  Administered 2018-06-02 – 2018-06-05 (×4): 10 mg via ORAL
  Filled 2018-06-01 (×6): qty 1

## 2018-06-01 MED ORDER — ONDANSETRON HCL 4 MG PO TABS
4.0000 mg | ORAL_TABLET | Freq: Four times a day (QID) | ORAL | Status: DC | PRN
  Administered 2018-06-02 – 2018-06-05 (×3): 4 mg via ORAL
  Filled 2018-06-01 (×3): qty 1

## 2018-06-01 MED ORDER — ONDANSETRON HCL 4 MG/2ML IJ SOLN
4.0000 mg | Freq: Four times a day (QID) | INTRAMUSCULAR | Status: DC | PRN
  Administered 2018-06-04: 4 mg via INTRAVENOUS
  Filled 2018-06-01: qty 2

## 2018-06-01 MED ORDER — SODIUM CHLORIDE 0.9 % IV SOLN
INTRAVENOUS | Status: DC
  Administered 2018-06-01 – 2018-06-03 (×7): via INTRAVENOUS

## 2018-06-01 MED ORDER — DOCUSATE SODIUM 100 MG PO CAPS
100.0000 mg | ORAL_CAPSULE | Freq: Two times a day (BID) | ORAL | Status: DC
  Administered 2018-06-01 – 2018-06-05 (×6): 100 mg via ORAL
  Filled 2018-06-01 (×8): qty 1

## 2018-06-01 MED ORDER — AMITRIPTYLINE HCL 10 MG PO TABS
10.0000 mg | ORAL_TABLET | Freq: Every day | ORAL | Status: DC
  Administered 2018-06-01 – 2018-06-04 (×4): 10 mg via ORAL
  Filled 2018-06-01 (×5): qty 1

## 2018-06-01 MED ORDER — CALCIUM CARBONATE ANTACID 500 MG PO CHEW
1.0000 | CHEWABLE_TABLET | Freq: Four times a day (QID) | ORAL | Status: DC | PRN
  Administered 2018-06-03: 200 mg via ORAL
  Filled 2018-06-01: qty 1

## 2018-06-01 MED ORDER — ACETAMINOPHEN 650 MG RE SUPP: 650.0000 mg | Freq: Four times a day (QID) | RECTAL | Status: DC | PRN

## 2018-06-01 MED ORDER — ACETAMINOPHEN 325 MG PO TABS
650.0000 mg | ORAL_TABLET | Freq: Four times a day (QID) | ORAL | Status: DC | PRN
  Administered 2018-06-02 (×2): 650 mg via ORAL
  Filled 2018-06-01 (×2): qty 2

## 2018-06-01 MED ORDER — MORPHINE SULFATE (PF) 2 MG/ML IV SOLN: 2.0000 mg | INTRAVENOUS | Status: DC | PRN

## 2018-06-01 NOTE — ED Notes (Signed)
ED Provider at bedside. 

## 2018-06-01 NOTE — ED Notes (Signed)
Admitting md at bedside

## 2018-06-01 NOTE — Progress Notes (Signed)
   06/01/18 1100  Clinical Encounter Type  Visited With Patient and family together  Visit Type Initial (Advance Directives Education)  Referral From Physician  Recommendations Follow-up if requested.  Advance Directives (For Healthcare)  Does Patient Have a Medical Advance Directive? No (Advance Directives booklet left with patient.)   Patient was noncommittal about receiving AD education. Her husband preferred leaving the AD booklet and letting them review the material on their own. Chaplain reminded the couple of the sections and how to complete the AD off-campus.  Chaplain closed the OR, but will return to complete the education and AD if requested by the patient.

## 2018-06-01 NOTE — Consult Note (Signed)
Kendra Darby, MD [address]  [place]  [place], [place] [address]  Main: [phone]  Fax: [phone] Pager: [phone]   Consultation  Referring Provider:     No ref. provider found Primary Care Physician:  Kendra Hartigan, MD Primary Gastroenterologist:  Dr. Sherri Sear         Reason for Consultation:     Gallstone pancreatitis, dilated bile ducts  Date of Admission:  06/01/2018 Date of Consultation:  06/01/2018         HPI:   Kendra Adkins is a 62 y.o. Caucasian female with hypertension admitted with 2 days history of acute epigastric pain associated with nausea and vomiting that started after eating hot dog and was found to have gallstone pancreatitis, acute cholecystitis. She reports having similar self-limited, less severe episode a few months ago. On arrival, to the ER, she was found to have elevated LFTs, alkaline phosphatase 269, AST/ALT 835/479, total bilirubin 1.8, direct bilirubin 1.1.Lipase was 75. No evidence of leukocytosis. Patient underwent ultrasound abdomen which revealed gallstones and gallbladder wall thickening and mild intrahepatic biliary ductal dilation. Normal CBD. Patient is kept nothing by mouth and GI is consulted to evaluate for possible ERCP. When I saw the patient, she denied any epigastric pain, nausea and vomiting. She reports that her symptoms subsided 13 hours ago. She wants to try to eat something. She denies fever, chills.  Her husband is bedside She drinks alcohol occasionally, denies smoking  NSAIDs: none  Antiplts/Anticoagulants/Anti thrombotics: none  GI Procedures: unknown  Past Medical History:  Diagnosis Date  . Diverticula of colon   . Endometriosis   . Head ache   . History of migraine headaches   . Increased BMI   . Insomnia   . Menopause     History reviewed. No pertinent surgical history.  Prior to Admission medications   Medication Sig Start Date End Date Taking? Authorizing Provider  amitriptyline  (ELAVIL) 10 MG tablet Take 10 mg by mouth at bedtime.   Yes [provider]  lisinopril (PRINIVIL,ZESTRIL) 10 MG tablet Take 10 mg by mouth daily. 04/19/18  Yes [provider]    Family History  Problem Relation Age of Onset  . Diabetes Mother   . Congestive Heart Failure Mother   . Diabetes Brother   . Breast cancer Cousin        mat cousin  . Cancer Neg Hx      Social History   Tobacco Use  . Smoking status: Former Research scientist (life sciences)  . Smokeless tobacco: Never Used  Substance Use Topics  . Alcohol use: Yes    Alcohol/week: 0.0 standard drinks    Comment: occas  . Drug use: No    Allergies as of 06/01/2018 - Review Complete 06/01/2018  Allergen Reaction Noted  . Biaxin [clarithromycin] Other (See Comments) 01/13/2015    Review of Systems:    All systems reviewed and negative except where noted in HPI.   Physical Exam:  Vital signs in last 24 hours: Temp:  [98.2 F (36.8 C)] 98.2 F (36.8 C) (09/09 1359) Pulse Rate:  [79-99] 86 (09/09 1359) Resp:  [7-23] 16 (09/09 1359) BP: (136-163)/(72-87) 144/82 (09/09 1359) SpO2:  [97 %-100 %] 98 % (09/09 1359) Weight:  [72.6 kg] 72.6 kg (09/09 0147) Last BM Date: 05/31/18 General:   Pleasant, cooperative in NAD Head:  Normocephalic and atraumatic. Eyes:   No icterus.   Conjunctiva pink. PERRLA. Ears:  Normal auditory acuity. Neck:  Supple; no masses or thyroidomegaly Lungs: Respirations even and unlabored. Lungs clear to auscultation bilaterally.   No wheezes, crackles, or rhonchi.  Heart:  Regular rate and rhythm;  Without murmur, clicks, rubs or gallops Abdomen:  Soft, nondistended, nontender. Normal bowel sounds. No appreciable masses or hepatomegaly.  No rebound or guarding.  Rectal:  Not performed. Msk:  Symmetrical without gross deformities.  Strength normal  Extremities:  Without edema, cyanosis or clubbing. Neurologic:  Alert and oriented x3;  grossly normal neurologically. Skin:  Intact without significant  lesions or rashes. Cervical Nodes:  No significant cervical adenopathy. Psych:  Alert and cooperative. Normal affect.  LAB RESULTS: CBC Latest Ref Rng & Units 06/01/2018 03/21/2015 11/12/2011  WBC 3.6 - 11.0 K/uL 7.4 10.1 11.5(H)  Hemoglobin 12.0 - 16.0 g/dL 11.7(L) 12.6 14.0  Hematocrit 35.0 - 47.0 % 35.2 40.6 42.2  Platelets 150 - 440 K/uL 373 468(H) 275    BMET BMP Latest Ref Rng & Units 06/01/2018 11/12/2011  Glucose 70 - 99 mg/dL 132(H) 110(H)  BUN 8 - 23 mg/dL 16 14  Creatinine 0.44 - 1.00 mg/dL 0.99 1.05  Sodium 135 - 145 mmol/L 139 139  Potassium 3.5 - 5.1 mmol/L 4.2 3.4(L)  Chloride 98 - 111 mmol/L 105 102  CO2 22 - 32 mmol/L 26 23  Calcium 8.9 - 10.3 mg/dL 9.6 9.3    LFT Hepatic Function Latest Ref Rng & Units 06/01/2018 11/12/2011  Total Protein 6.5 - 8.1 g/dL 7.2 8.2  Albumin 3.5 - 5.0 g/dL 4.0 4.3  AST 15 - 41 U/L 835(H) 31  ALT 0 - 44 U/L 479(H) 36  Alk Phosphatase 38 - 126 U/L 269(H) 92  Total Bilirubin 0.3 - 1.2 mg/dL 1.8(H) 0.3  Bilirubin, Direct 0.0 - 0.2 mg/dL 1.1(H) -     STUDIES: Mr Abdomen Mrcp Wo Contrast  Result Date: 06/01/2018 CLINICAL DATA:  Inpatient. Cholelithiasis. Abnormal liver function tests. EXAM: MRI ABDOMEN WITHOUT CONTRAST  (INCLUDING MRCP) TECHNIQUE: Multiplanar multisequence MR imaging of the abdomen was performed. Heavily T2-weighted images of the biliary and pancreatic ducts were obtained, and three-dimensional MRCP images were rendered by post processing. COMPARISON:  06/01/2018 abdominal sonogram. FINDINGS: Lower chest: No acute abnormality at the lung bases. Hepatobiliary: Normal liver size and configuration. No hepatic steatosis. No liver mass. Numerous gallstones fill much of the gallbladder measuring up to 1.0 cm in size. No gallbladder distention. No gallbladder wall thickening. No pericholecystic fluid. No biliary ductal dilatation. Common bile duct diameter 4 mm. No evidence of choledocholithiasis. Pancreas: No pancreatic mass or duct  dilation.  No pancreas divisum. Spleen: Normal size. No mass. Adrenals/Urinary Tract: Normal adrenals. No hydronephrosis. Normal kidneys with no renal mass. Stomach/Bowel: Normal non-distended stomach. Visualized small and large bowel is normal caliber, with no bowel wall thickening. Vascular/Lymphatic: Normal caliber abdominal aorta. No pathologically enlarged lymph nodes in the abdomen. Other: No abdominal ascites or focal fluid collection. Musculoskeletal: No aggressive appearing focal osseous lesions. IMPRESSION: 1. Cholelithiasis.  No evidence of acute cholecystitis. 2. No biliary ductal dilatation. CBD diameter 4 mm. No evidence of choledocholithiasis. Electronically Signed   By: Ilona Sorrel M.D.   On: 06/01/2018 13:26   US Abdomen Limited Ruq  Result Date: 06/01/2018 CLINICAL DATA:  Epigastric abdominal pain. EXAM: ULTRASOUND ABDOMEN LIMITED RIGHT UPPER QUADRANT COMPARISON:  None. FINDINGS: Gallbladder: Filled with stones and sludge. Mild focal gallbladder wall thickening of 3 mm. No pericholecystic fluid. No sonographic Murphy sign noted by sonographer. Common bile duct: Diameter: 6 mm, borderline.  Liver: Mild central intrahepatic biliary ductal dilatation. No focal lesion identified. Heterogeneous and mildly increased in parenchymal echogenicity. Portal vein is patent on color Doppler imaging with normal direction of blood flow towards the liver. IMPRESSION: 1. Gallbladder filled with stones and sludge with mild focal gallbladder wall thickening. No pericholecystic fluid or sonographic Murphy sign. 2. Borderline common bile duct at 6 mm with central intrahepatic biliary ductal dilatation. Consider further evaluation with MRCP or nuclear medicine HIDA scan. 3. Increased hepatic echogenicity consistent with hepatocellular disease, most commonly steatosis. Electronically Signed   By: Keith Rake M.D.   On: 06/01/2018 03:52      Impression / Plan:   Kendra Adkins is a 62 y.o. Caucasian female  with cholelithiasis, presented with gallstone pancreatitis and acute cholecystitis. She had mild intrahepatic biliary ductal dilation. However, MRCP revealed normal CBD and no evidence of choledocholithiasis. Her bilirubin is mildly elevated and currently patient is asymptomatic. Probably, she passed the stone   - okay to have ice chips - Continue IV fluids - Monitor LFTs - No indication for ERCP at this time - Consult general surgery for cholecystectomy  Thank you for involving me in the care of this patient.      LOS: 0 days   Sherri Sear, MD  06/01/2018, 5:13 PM   Note: This dictation was prepared with Dragon dictation along with smaller phrase technology. Any transcriptional errors that result from this process are unintentional.

## 2018-06-01 NOTE — ED Notes (Signed)
Patient transported to Ultrasound 

## 2018-06-01 NOTE — ED Triage Notes (Signed)
Onset of chest pain Saturday after eating a hotdog. Had not eaten a hot dog in over 20 years so thought it might be that. Patient points to epigastric as area of pain and states she does still have her gallbladder. Tried to make herself throw up to feel better but couldn't Denies [facility], N/V/D or diaphoresis. Patient speaking in full sentences without distress.

## 2018-06-01 NOTE — H&P (Signed)
Kendra Adkins is an 62 y.o. female.   Chief Complaint: Chest pain HPI: The patient with past medical history of diverticulosis and endometriosis presents to the emergency department complaining of chest pain.  The patient states that she has had pain under her right rib cage for at least 24 hours.  She felt as though the pain may be heartburn due to associated nausea but the pain has increased in severity that prompted concern for more serious etiology.  Since arrival in the emergency department the patient has had an ultrasound of her right upper quadrant which demonstrated likely steatosis as well as dilated bile ducts.  Laboratory evaluation shows transaminitis as well as elevated lipase which prompted the emergency department staff called the hospitalist service for admission.  Past Medical History:  Diagnosis Date  . Diverticula of colon   . Endometriosis   . Head ache   . History of migraine headaches   . Increased BMI   . Insomnia   . Menopause     History reviewed. No pertinent surgical history.  Family History  Problem Relation Age of Onset  . Diabetes Mother   . Congestive Heart Failure Mother   . Diabetes Brother   . Breast cancer Cousin        mat cousin  . Cancer Neg Hx    Social History:  reports that she has quit smoking. She has never used smokeless tobacco. She reports that she drinks alcohol. She reports that she does not use drugs.  Allergies:  Allergies  Allergen Reactions  . Biaxin [Clarithromycin] Other (See Comments)    Reaction: blisters    Medications Prior to Admission  Medication Sig Dispense Refill  . amitriptyline (ELAVIL) 10 MG tablet Take 10 mg by mouth at bedtime.    Marland Kitchen lisinopril (PRINIVIL,ZESTRIL) 10 MG tablet Take 10 mg by mouth daily.  3    Results for orders placed or performed during the hospital encounter of 06/01/18 (from the past 48 hour(s))  Basic metabolic panel     Status: Abnormal   Collection Time: 06/01/18  1:52 AM  Result  Value Ref Range   Sodium 139 135 - 145 mmol/L   Potassium 4.2 3.5 - 5.1 mmol/L   Chloride 105 98 - 111 mmol/L   CO2 26 22 - 32 mmol/L   Glucose, Bld 132 (H) 70 - 99 mg/dL   BUN 16 8 - 23 mg/dL   Creatinine, Ser 0.99 0.44 - 1.00 mg/dL   Calcium 9.6 8.9 - 10.3 mg/dL   GFR calc non Af Amer 59 (L) >60 mL/min   GFR calc Af Amer >60 >60 mL/min    Comment: (NOTE) The eGFR has been calculated using the CKD EPI equation. This calculation has not been validated in all clinical situations. eGFR's persistently <60 mL/min signify possible Chronic Kidney Disease.    Anion gap 8 5 - 15    Comment: Performed at [facility], [address] [place], [place] [address]  CBC     Status: Abnormal   Collection Time: 06/01/18  1:52 AM  Result Value Ref Range   WBC 7.4 3.6 - 11.0 K/uL   RBC 4.19 3.80 - 5.20 MIL/uL   Hemoglobin 11.7 (L) 12.0 - 16.0 g/dL   HCT 35.2 35.0 - 47.0 %   MCV 84.1 80.0 - 100.0 fL   MCH 27.8 26.0 - 34.0 pg   MCHC 33.1 32.0 - 36.0 g/dL   RDW 15.5 (H) 11.5 - 14.5 %  Platelets 373 150 - 440 K/uL    Comment: Performed at [facility], [address] [place], [place] [address]  Troponin I     Status: None   Collection Time: 06/01/18  1:52 AM  Result Value Ref Range   Troponin I <0.03 <0.03 ng/mL    Comment: Performed at [facility], [address] [place], [place] [address]  Hepatic function panel     Status: Abnormal   Collection Time: 06/01/18  1:52 AM  Result Value Ref Range   Total Protein 7.2 6.5 - 8.1 g/dL   Albumin 4.0 3.5 - 5.0 g/dL   AST 835 (H) 15 - 41 U/L   ALT 479 (H) 0 - 44 U/L   Alkaline Phosphatase 269 (H) 38 - 126 U/L   Total Bilirubin 1.8 (H) 0.3 - 1.2 mg/dL   Bilirubin, Direct 1.1 (H) 0.0 - 0.2 mg/dL   Indirect Bilirubin 0.7 0.3 - 0.9 mg/dL    Comment: Performed at [facility], [address] [place], [place] [address]  Lipase, blood     Status: Abnormal   Collection Time: 06/01/18  1:52 AM  Result Value Ref  Range   Lipase 75 (H) 11 - 51 U/L    Comment: Performed at [facility], [address] [place], [place] [address]   US Abdomen Limited Ruq  Result Date: 06/01/2018 CLINICAL DATA:  Epigastric abdominal pain. EXAM: ULTRASOUND ABDOMEN LIMITED RIGHT UPPER QUADRANT COMPARISON:  None. FINDINGS: Gallbladder: Filled with stones and sludge. Mild focal gallbladder wall thickening of 3 mm. No pericholecystic fluid. No sonographic Murphy sign noted by sonographer. Common bile duct: Diameter: 6 mm, borderline. Liver: Mild central intrahepatic biliary ductal dilatation. No focal lesion identified. Heterogeneous and mildly increased in parenchymal echogenicity. Portal vein is patent on color Doppler imaging with normal direction of blood flow towards the liver. IMPRESSION: 1. Gallbladder filled with stones and sludge with mild focal gallbladder wall thickening. No pericholecystic fluid or sonographic Murphy sign. 2. Borderline common bile duct at 6 mm with central intrahepatic biliary ductal dilatation. Consider further evaluation with MRCP or nuclear medicine HIDA scan. 3. Increased hepatic echogenicity consistent with hepatocellular disease, most commonly steatosis. Electronically Signed   By: Keith Rake M.D.   On: 06/01/2018 03:52    Review of Systems  Constitutional: Negative for chills and fever.  HENT: Negative for sore throat and tinnitus.   Eyes: Negative for blurred vision and redness.  Respiratory: Negative for cough and shortness of breath.   Cardiovascular: Positive for chest pain. Negative for palpitations, orthopnea and PND.  Gastrointestinal: Positive for nausea. Negative for abdominal pain, diarrhea and vomiting.  Genitourinary: Negative for dysuria, frequency and urgency.  Musculoskeletal: Negative for joint pain and myalgias.  Skin: Negative for rash.       No lesions  Neurological: Negative for speech change, focal weakness and weakness.  Endo/Heme/Allergies: Does not  bruise/bleed easily.       No temperature intolerance  Psychiatric/Behavioral: Negative for depression and suicidal ideas.    Blood pressure 136/73, pulse 81, temperature 98.2 F (36.8 C), temperature source Oral, resp. rate 20, height _0  (1.626 m), weight 72.6 kg, SpO2 100 %. Physical Exam  Vitals reviewed. Constitutional: She is oriented to person, place, and time. She appears well-developed and well-nourished. No distress.  HENT:  Head: Normocephalic and atraumatic.  Mouth/Throat: Oropharynx is clear and moist.  Eyes: Pupils are equal, round, and reactive to light. Conjunctivae and EOM are normal. No scleral  icterus.  Neck: Normal range of motion. Neck supple. No JVD present. No tracheal deviation present. No thyromegaly present.  Cardiovascular: Normal rate, regular rhythm and normal heart sounds. Exam reveals no gallop and no friction rub.  No murmur heard. Respiratory: Effort normal and breath sounds normal.  GI: Soft. Bowel sounds are normal. She exhibits no distension. There is no tenderness.  Genitourinary:  Genitourinary Comments: Deferred  Musculoskeletal: Normal range of motion. She exhibits no edema.  Lymphadenopathy:    She has no cervical adenopathy.  Neurological: She is alert and oriented to person, place, and time. No cranial nerve deficit. She exhibits normal muscle tone.  Skin: Skin is warm and dry. No rash noted. No erythema.  Psychiatric: She has a normal mood and affect. Her behavior is normal. Judgment and thought content normal.     Assessment/Plan This is a 62 year old female admitted for transaminitis. 1.  Transaminitis: Hepatitis; ultrasound demonstrates dilated bile ducts but shows no stone.  Will need MRCP.  Consult gastroenterology.  Manage severe pain with IV morphine as needed.  Rule out infectious hepatitis or mass. 2.  Overweight: BMI is 27.4; encouraged healthy diet and exercise 3.  DVT prophylaxis: SCDs 4.  GI prophylaxis: None The patient is  a full code.  Time spent on admission orders and patient care approximately 45 minutes  Harrie Foreman, MD 06/01/2018, 6:36 AM

## 2018-06-01 NOTE — ED Provider Notes (Signed)
[facility] Emergency Department Provider Note  ____________________________________________   First MD Initiated Contact with Patient 06/01/18 [phone]     (approximate)  I have reviewed the triage vital signs and the nursing notes.   HISTORY  Chief Complaint Chest Pain   HPI NYLIAH NIERENBERG is a 62 y.o. female who self presents to the emergency department with several days of epigastric pain.  The pain is in her epigastrium radiating up towards her lower chest.  Associated with nausea and belching.  No vomiting.   Symptoms seem to begin after she ate a hot dog for the first time in 20 years.  Her pain is clearly worse after eating and somewhat improved but not.  Nonexertional.  No leg swelling.  No history of DVT or pulmonary embolism.  The pain is not ripping or tearing it does not go straight to her back.  No hemoptysis.  No recent surgery travel or immobilization.  She does have a history of "ulcers" and has been taking Prilosec intermittently.  She has been taking a "handful of Tums" which does not seem to be helping.   Past Medical History:  Diagnosis Date  . Diverticula of colon   . Endometriosis   . Head ache   . History of migraine headaches   . Increased BMI   . Insomnia   . Menopause     Patient Active Problem List   Diagnosis Date Noted  . Transaminitis 06/01/2018  . Elevated systolic blood pressure reading without diagnosis of hypertension 04/01/2017  . Diverticulosis 03/21/2015  . Endometriosis 03/21/2015  . Menopause 03/21/2015  . Migraines 03/21/2015  . Insomnia 03/21/2015    History reviewed. No pertinent surgical history.  Prior to Admission medications   Medication Sig Start Date End Date Taking? Authorizing Provider  amitriptyline (ELAVIL) 10 MG tablet Take 10 mg by mouth at bedtime.   Yes [provider]  lisinopril (PRINIVIL,ZESTRIL) 10 MG tablet Take 10 mg by mouth daily. 04/19/18  Yes [provider]     Allergies Biaxin [clarithromycin]  Family History  Problem Relation Age of Onset  . Diabetes Mother   . Congestive Heart Failure Mother   . Diabetes Brother   . Breast cancer Cousin        mat cousin  . Cancer Neg Hx     Social History Social History   Tobacco Use  . Smoking status: Former Games developer  . Smokeless tobacco: Never Used  Substance Use Topics  . Alcohol use: Yes    Alcohol/week: 0.0 standard drinks    Comment: occas  . Drug use: No    Review of Systems Constitutional: No fever/chills Eyes: No visual changes. ENT: No sore throat. Cardiovascular: Positive for chest pain. Respiratory: Negative for shortness of breath. Gastrointestinal: Positive for abdominal pain.  Positive for nausea, no vomiting.  No diarrhea.  No constipation. Genitourinary: Negative for dysuria. Musculoskeletal: Negative for back pain. Skin: Negative for rash. Neurological: Negative for headaches, focal weakness or numbness.   ____________________________________________   PHYSICAL EXAM:  VITAL SIGNS: ED Triage Vitals  Enc Vitals Group     BP 06/01/18 0147 (!) 160/72     Pulse Rate 06/01/18 0147 99     Resp 06/01/18 0147 18     Temp 06/01/18 0147 98.2 F (36.8 C)     Temp Source 06/01/18 0147 Oral     SpO2 06/01/18 0147 98 %     Weight 06/01/18 0147 160 lb (72.6 kg)  Height 06/01/18 0147 5\' 4"  (1.626 m)     Head Circumference --      Peak Flow --      Pain Score 06/01/18 0157 5     Pain Loc --      Pain Edu? --      Excl. in GC? --     Constitutional: Alert and oriented x4 obviously uncomfortable although nontoxic no diaphoresis speaks in full clear sentences Eyes: PERRL EOMI. Head: Atraumatic. Nose: No congestion/rhinnorhea. Mouth/Throat: No trismus Neck: No stridor.   Cardiovascular: Normal rate, regular rhythm. Grossly normal heart sounds.  Good peripheral circulation. Respiratory: Normal respiratory effort.  No retractions. Lungs CTAB and moving good  air Gastrointestinal: Soft nondistended somewhat tender over the epigastrium particularly the right upper quadrant although negative Murphy's no rebound or guarding no peritonitis Musculoskeletal: No lower extremity edema   Neurologic:  Normal speech and language. No gross focal neurologic deficits are appreciated. Skin:  Skin is warm, dry and intact. No rash noted. Psychiatric: Mood and affect are normal. Speech and behavior are normal.    ____________________________________________   DIFFERENTIAL includes but not limited to  Biliary colic, cholecystitis, cholangitis, pancreatitis, gastric ulcer, gastritis, acute coronary syndrome ____________________________________________   LABS (all labs ordered are listed, but only abnormal results are displayed)  Labs Reviewed  BASIC METABOLIC PANEL - Abnormal; Notable for the following components:      Result Value   Glucose, Bld 132 (*)    GFR calc non Af Amer 59 (*)    All other components within normal limits  CBC - Abnormal; Notable for the following components:   Hemoglobin 11.7 (*)    RDW 15.5 (*)    All other components within normal limits  HEPATIC FUNCTION PANEL - Abnormal; Notable for the following components:   AST 835 (*)    ALT 479 (*)    Alkaline Phosphatase 269 (*)    Total Bilirubin 1.8 (*)    Bilirubin, Direct 1.1 (*)    All other components within normal limits  LIPASE, BLOOD - Abnormal; Notable for the following components:   Lipase 75 (*)    All other components within normal limits  TROPONIN I  HEPATITIS PANEL, ACUTE  GAMMA GT    Lab work reviewed by me with significant transaminitis concerning for obstruction __________________________________________  EKG  ED ECG REPORT I, Merrily Brittle, the attending physician, personally viewed and interpreted this ECG.  Date: 06/01/2018 EKG Time:  Rate: 99 Rhythm: normal sinus rhythm QRS Axis: normal Intervals: normal ST/T Wave abnormalities:  normal Narrative Interpretation: no evidence of acute ischemia  ____________________________________________  RADIOLOGY  Right upper quadrant ultrasound reviewed by me with large amount of stones and sludge and borderline CBD not consistent with cholecystitis ____________________________________________   PROCEDURES  Procedure(s) performed: no  Procedures  Critical Care performed: no  ____________________________________________   INITIAL IMPRESSION / ASSESSMENT AND PLAN / ED COURSE  Pertinent labs & imaging results that were available during my care of the patient were reviewed by me and considered in my medical decision making (see chart for details).   As part of my medical decision making, I reviewed the following data within the electronic MEDICAL RECORD NUMBER History obtained from family if available, nursing notes, old chart and ekg, as well as notes from prior ED visits.  The patient arrives obviously uncomfortable appearing but declines pain medication at this point saying her pain has actually passed.  I performed a bedside ultrasound which showed a gallbladder full  of sludge with some posterior shadowing.  Gallbladder wall looks normal and I was unable to appreciate any obvious CBD although exam was somewhat limited by the patient's habitus.  Formal ultrasound is pending but anticipate her diagnosis will be biliary colic.     The patient's ultrasound shows a large number of stones along with a borderline CBD.  No evidence of cholecystitis.  At this point I believe the patient requires inpatient admission for MRCP and to trend LFTs she very well could have an active biliary obstruction.  Again she declines pain medication.  I discussed with the hospitalist Dr. Sheryle Hail who has graciously agreed to admit the patient to his service. ____________________________________________   FINAL CLINICAL IMPRESSION(S) / ED DIAGNOSES  Final diagnoses:  Pain  Biliary colic   Choledocholithiasis      NEW MEDICATIONS STARTED DURING THIS VISIT:  New Prescriptions   No medications on file     Note:  This document was prepared using Dragon voice recognition software and may include unintentional dictation errors.     Merrily Brittle, MD 06/01/18 [phone]

## 2018-06-01 NOTE — Progress Notes (Signed)
Advance care planning  Purpose of Encounter Transaminites and acute hospitalization  Parties in Attendance Patient and husband at bedside  Patients Decisional capacity Alert and oriented. Able to make medical desicions   Discussed regarding elevated liver enzymes and need for MRCP and GI evaluation. Patient understands and all questions answered.  Does not have documentation in place for HCPOA and Living will. Encouraged to work on documenting. She wants husband and 2 kids to be HCPOAs   FULL CODE  Time spent - 18 minutes

## 2018-06-01 NOTE — Progress Notes (Signed)
Pt notified RN that she wants to try the MRI without sedation. She states she does not want sedation at this time and thinks she will be able to tolerate MRI as she can tolerate a tanning bed. Pt educated she can ask for additional medication if she needs to. Pt verbalizes understanding. MRI notified.

## 2018-06-02 DIAGNOSIS — K8050 Calculus of bile duct without cholangitis or cholecystitis without obstruction: Secondary | ICD-10-CM

## 2018-06-02 DIAGNOSIS — R740 Nonspecific elevation of levels of transaminase and lactic acid dehydrogenase [LDH]: Secondary | ICD-10-CM

## 2018-06-02 LAB — COMPREHENSIVE METABOLIC PANEL
ALK PHOS: 236 U/L — AB (ref 38–126)
ALT: 470 U/L — AB (ref 0–44)
ANION GAP: 5 (ref 5–15)
AST: 392 U/L — ABNORMAL HIGH (ref 15–41)
Albumin: 3.3 g/dL — ABNORMAL LOW (ref 3.5–5.0)
BILIRUBIN TOTAL: 1.4 mg/dL — AB (ref 0.3–1.2)
BUN: 11 mg/dL (ref 8–23)
CALCIUM: 8.5 mg/dL — AB (ref 8.9–10.3)
CO2: 24 mmol/L (ref 22–32)
CREATININE: 0.86 mg/dL (ref 0.44–1.00)
Chloride: 112 mmol/L — ABNORMAL HIGH (ref 98–111)
GFR calc non Af Amer: >60 mL/min (ref >60)
GLUCOSE: 90 mg/dL (ref 70–99)
Potassium: 4 mmol/L (ref 3.5–5.1)
Sodium: 141 mmol/L (ref 135–145)
TOTAL PROTEIN: 6 g/dL — AB (ref 6.5–8.1)

## 2018-06-02 LAB — HEPATITIS PANEL, ACUTE
HCV Ab: <0.1 s/co ratio (ref 0.0–0.9)
Hep A IgM: NEGATIVE
Hep B C IgM: NEGATIVE
Hepatitis B Surface Ag: NEGATIVE

## 2018-06-02 LAB — HEMOGLOBIN A1C
Hgb A1c MFr Bld: 5.6 % (ref 4.8–5.6)
Mean Plasma Glucose: 114 mg/dL

## 2018-06-02 MED ORDER — IBUPROFEN 400 MG PO TABS
400.0000 mg | ORAL_TABLET | Freq: Four times a day (QID) | ORAL | Status: DC | PRN
  Administered 2018-06-02: 400 mg via ORAL
  Filled 2018-06-02 (×2): qty 1

## 2018-06-02 MED ORDER — IBUPROFEN 400 MG PO TABS
400.0000 mg | ORAL_TABLET | ORAL | Status: DC | PRN
  Administered 2018-06-02 – 2018-06-05 (×5): 400 mg via ORAL
  Filled 2018-06-02 (×4): qty 1

## 2018-06-02 MED ORDER — PROMETHAZINE HCL 25 MG/ML IJ SOLN
12.5000 mg | Freq: Four times a day (QID) | INTRAMUSCULAR | Status: DC | PRN
  Administered 2018-06-02 – 2018-06-04 (×2): 12.5 mg via INTRAVENOUS
  Filled 2018-06-02 (×2): qty 1

## 2018-06-02 MED ORDER — IBUPROFEN 400 MG PO TABS
400.0000 mg | ORAL_TABLET | Freq: Once | ORAL | Status: AC
  Administered 2018-06-02: 400 mg via ORAL
  Filled 2018-06-02: qty 1

## 2018-06-02 MED ORDER — ENOXAPARIN SODIUM 40 MG/0.4ML ~~LOC~~ SOLN
40.0000 mg | SUBCUTANEOUS | Status: DC
  Administered 2018-06-02 – 2018-06-04 (×3): 40 mg via SUBCUTANEOUS
  Filled 2018-06-02 (×3): qty 0.4

## 2018-06-02 NOTE — Progress Notes (Signed)
[facility] - [place] [facility]   PATIENT NAME: Kendra Adkins    MR#:  [id number]  DATE OF BIRTH:  1956/09/11  SUBJECTIVE:  CHIEF COMPLAINT:   Chief Complaint  Patient presents with  . Chest Pain   No abd pain No vomiting/nausea  REVIEW OF SYSTEMS:    Review of Systems  Constitutional: Positive for malaise/fatigue. Negative for chills, fever and weight loss.  HENT: Negative for hearing loss and nosebleeds.   Eyes: Negative for blurred vision, double vision and pain.  Respiratory: Negative for cough, hemoptysis, sputum production, shortness of breath and wheezing.   Cardiovascular: Negative for chest pain, palpitations, orthopnea and leg swelling.  Gastrointestinal: Negative for abdominal pain, constipation, diarrhea, nausea and vomiting.  Genitourinary: Negative for dysuria and hematuria.  Musculoskeletal: Negative for back pain, falls and myalgias.  Skin: Negative for rash.  Neurological: Positive for headaches. Negative for dizziness, tremors, sensory change, speech change, focal weakness and seizures.  Endo/Heme/Allergies: Does not bruise/bleed easily.  Psychiatric/Behavioral: Negative for depression and memory loss. The patient is not nervous/anxious.     DRUG ALLERGIES:   Allergies  Allergen Reactions  . Biaxin [Clarithromycin] Other (See Comments)    Reaction: blisters    VITALS:  Blood pressure (!) 151/68, pulse 82, temperature 98.4 F (36.9 C), temperature source Oral, resp. rate 18, height 5\' 4"  (1.626 m), weight 74.8 kg, SpO2 100 %.  PHYSICAL EXAMINATION:   Physical Exam  GENERAL:  62 y.o.-year-old patient lying in the bed with no acute distress.  EYES: Pupils equal, round, reactive to light and accommodation. No scleral icterus. Extraocular muscles intact.  HEENT: Head atraumatic, normocephalic. Oropharynx and nasopharynx clear.  NECK:  Supple, no jugular venous distention. No thyroid enlargement, no tenderness.  LUNGS: Normal breath  sounds bilaterally, no wheezing, rales, rhonchi. No use of accessory muscles of respiration.  CARDIOVASCULAR: S1, S2 normal. No murmurs, rubs, or gallops.  ABDOMEN: Soft, nontender, nondistended. Bowel sounds present. No organomegaly or mass.  EXTREMITIES: No cyanosis, clubbing or edema b/l.    NEUROLOGIC: Cranial nerves II through XII are intact. No focal Motor or sensory deficits b/l.   PSYCHIATRIC: The patient is alert and oriented x 3.  SKIN: No obvious rash, lesion, or ulcer.   LABORATORY PANEL:   CBC Recent Labs  Lab 06/01/18 0152  WBC 7.4  HGB 11.7*  HCT 35.2  PLT 373   ------------------------------------------------------------------------------------------------------------------ Chemistries  Recent Labs  Lab 06/02/18 0447  NA 141  K 4.0  CL 112*  CO2 24  GLUCOSE 90  BUN 11  CREATININE 0.86  CALCIUM 8.5*  AST 392*  ALT 470*  ALKPHOS 236*  BILITOT 1.4*   ------------------------------------------------------------------------------------------------------------------  Cardiac Enzymes Recent Labs  Lab 06/01/18 0152  TROPONINI <0.03   ------------------------------------------------------------------------------------------------------------------  RADIOLOGY:  Mr Abdomen Mrcp Wo Contrast  Result Date: 06/01/2018 CLINICAL DATA:  Inpatient. Cholelithiasis. Abnormal liver function tests. EXAM: MRI ABDOMEN WITHOUT CONTRAST  (INCLUDING MRCP) TECHNIQUE: Multiplanar multisequence MR imaging of the abdomen was performed. Heavily T2-weighted images of the biliary and pancreatic ducts were obtained, and three-dimensional MRCP images were rendered by post processing. COMPARISON:  06/01/2018 abdominal sonogram. FINDINGS: Lower chest: No acute abnormality at the lung bases. Hepatobiliary: Normal liver size and configuration. No hepatic steatosis. No liver mass. Numerous gallstones fill much of the gallbladder measuring up to 1.0 cm in size. No gallbladder distention. No  gallbladder wall thickening. No pericholecystic fluid. No biliary ductal dilatation. Common bile duct diameter 4 mm. No evidence of choledocholithiasis.  Pancreas: No pancreatic mass or duct dilation.  No pancreas divisum. Spleen: Normal size. No mass. Adrenals/Urinary Tract: Normal adrenals. No hydronephrosis. Normal kidneys with no renal mass. Stomach/Bowel: Normal non-distended stomach. Visualized small and large bowel is normal caliber, with no bowel wall thickening. Vascular/Lymphatic: Normal caliber abdominal aorta. No pathologically enlarged lymph nodes in the abdomen. Other: No abdominal ascites or focal fluid collection. Musculoskeletal: No aggressive appearing focal osseous lesions. IMPRESSION: 1. Cholelithiasis.  No evidence of acute cholecystitis. 2. No biliary ductal dilatation. CBD diameter 4 mm. No evidence of choledocholithiasis. Electronically Signed   By: Delbert Phenix M.D.   On: 06/01/2018 13:26   US Abdomen Limited Ruq  Result Date: 06/01/2018 CLINICAL DATA:  Epigastric abdominal pain. EXAM: ULTRASOUND ABDOMEN LIMITED RIGHT UPPER QUADRANT COMPARISON:  None. FINDINGS: Gallbladder: Filled with stones and sludge. Mild focal gallbladder wall thickening of 3 mm. No pericholecystic fluid. No sonographic Murphy sign noted by sonographer. Common bile duct: Diameter: 6 mm, borderline. Liver: Mild central intrahepatic biliary ductal dilatation. No focal lesion identified. Heterogeneous and mildly increased in parenchymal echogenicity. Portal vein is patent on color Doppler imaging with normal direction of blood flow towards the liver. IMPRESSION: 1. Gallbladder filled with stones and sludge with mild focal gallbladder wall thickening. No pericholecystic fluid or sonographic Murphy sign. 2. Borderline common bile duct at 6 mm with central intrahepatic biliary ductal dilatation. Consider further evaluation with MRCP or nuclear medicine HIDA scan. 3. Increased hepatic echogenicity consistent with  hepatocellular disease, most commonly steatosis. Electronically Signed   By: Narda Rutherford M.D.   On: 06/01/2018 03:52     ASSESSMENT AND PLAN:   * Acute gallstone pancreatitis Lipase close to normal. No abd pain today. Gallbladder with multiple stones. MRCP with no CBD stone. Likely passed one Discussed with GI. Surgery consulted. Discussed with Dr. Excell Seltzer and cholecystectomy tomorrow if LFTs improved  * Transaminitis due to gall stone in CBD Improving  * DVT Prophylaxis Lovenox  All the records are reviewed and case discussed with Care Management/Social Worker Management plans discussed with the patient, family and they are in agreement.  CODE STATUS: FULL CODE  TOTAL TIME TAKING CARE OF THIS PATIENT: 35 minutes.   POSSIBLE D/C IN 2-3 DAYS, DEPENDING ON CLINICAL CONDITION.  Molinda Bailiff Jozi Malachi M.D on 06/02/2018 at 11:11 AM  Between 7am to 6pm - Pager - [phone]  After 6pm go to [email or web address] - password [email or web address] [facility]  [facility]  Office  [phone]  CC: Primary care physician; Marina Goodell, MD  Note: This dictation was prepared with Dragon dictation along with smaller phrase technology. Any transcriptional errors that result from this process are unintentional.

## 2018-06-02 NOTE — Plan of Care (Signed)

## 2018-06-02 NOTE — Consult Note (Signed)
Surgical Consultation  06/02/2018  Kendra Adkins is an 62 y.o. female.   Referring Physician: Sudini  CC: Gallstones  HPI: I was asked see this patient by Dr. Darvin Neighbours who I spoke to personally.  Patient describes abdominal pain that started on Saturday and has now completely resolved.  She had some back pain with it.  She had no nausea or vomiting at the time.  And feels much better now.  Her liver function tests were markedly elevated and are trending down but still far from normal.  She states she had an episode like this a year ago but none since she is not sure if it is related to fatty foods or not.  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  Past Medical History:  Diagnosis Date  . Diverticula of colon   . Endometriosis   . Head ache   . History of migraine headaches   . Increased BMI   . Insomnia   . Menopause     History reviewed. No pertinent surgical history.  Family History  Problem Relation Age of Onset  . Diabetes Mother   . Congestive Heart Failure Mother   . Diabetes Brother   . Breast cancer Cousin        mat cousin  . Cancer Neg Hx     Social History:  reports that she has quit smoking. She has never used smokeless tobacco. She reports that she drinks alcohol. She reports that she does not use drugs.  Allergies:  Allergies  Allergen Reactions  . Biaxin [Clarithromycin] Other (See Comments)    Reaction: blisters    Medications reviewed.   Review of Systems:   Review of Systems  Constitutional: Negative.   HENT: Negative.   Eyes: Negative.   Respiratory: Negative.   Cardiovascular: Negative.   Gastrointestinal: Positive for abdominal pain. Negative for diarrhea, nausea and vomiting.  Genitourinary: Negative.   Musculoskeletal: Negative.   Skin: Negative.   Neurological: Negative.   Endo/Heme/Allergies: Negative.   Psychiatric/Behavioral: Negative.      Physical  Exam:  BP (!) 151/68 (BP Location: Left Arm)   Pulse 82   Temp 98.4 F (36.9 C) (Oral)   Resp 18   Ht _0  (1.626 m)   Wt 74.8 kg   SpO2 100%   BMI 28.31 kg/m   Physical Exam  Constitutional: She is oriented to person, place, and time. She appears well-developed and well-nourished.  Non-toxic appearance. She does not appear ill. No distress.  HENT:  Head: Normocephalic and atraumatic.  Cardiovascular: Normal rate and regular rhythm.  Pulmonary/Chest: Effort normal. No respiratory distress.  Abdominal: Soft. She exhibits no distension. There is no tenderness. There is no rebound and no guarding.  Negative Murphy sign  Neurological: She is alert and oriented to person, place, and time.  Skin: Skin is warm and dry.  Vitals reviewed.     Results for orders placed or performed during the hospital encounter of 06/01/18 (from the past 48 hour(s))  Basic metabolic panel     Status: Abnormal   Collection Time: 06/01/18  1:52 AM  Result Value Ref Range   Sodium 139 135 - 145 mmol/L   Potassium 4.2 3.5 - 5.1 mmol/L   Chloride 105 98 - 111 mmol/L   CO2 26 22 - 32 mmol/L   Glucose, Bld 132 (H) 70 - 99 mg/dL   BUN 16 8 - 23 mg/dL  Creatinine, Ser 0.99 0.44 - 1.00 mg/dL   Calcium 9.6 8.9 - 10.3 mg/dL   GFR calc non Af Amer 59 (L) >60 mL/min   GFR calc Af Amer >60 >60 mL/min    Comment: (NOTE) The eGFR has been calculated using the CKD EPI equation. This calculation has not been validated in all clinical situations. eGFR's persistently <60 mL/min signify possible Chronic Kidney Disease.    Anion gap 8 5 - 15    Comment: Performed at [facility], [address] [place], [place] [address]  CBC     Status: Abnormal   Collection Time: 06/01/18  1:52 AM  Result Value Ref Range   WBC 7.4 3.6 - 11.0 K/uL   RBC 4.19 3.80 - 5.20 MIL/uL   Hemoglobin 11.7 (L) 12.0 - 16.0 g/dL   HCT 35.2 35.0 - 47.0 %   MCV 84.1 80.0 - 100.0 fL   MCH 27.8 26.0 - 34.0 pg   MCHC 33.1 32.0 -  36.0 g/dL   RDW 15.5 (H) 11.5 - 14.5 %   Platelets 373 150 - 440 K/uL    Comment: Performed at [facility], [address] [place], [place] [address]  Troponin I     Status: None   Collection Time: 06/01/18  1:52 AM  Result Value Ref Range   Troponin I <0.03 <0.03 ng/mL    Comment: Performed at [facility], [address] [place], [place] [address]  Hepatic function panel     Status: Abnormal   Collection Time: 06/01/18  1:52 AM  Result Value Ref Range   Total Protein 7.2 6.5 - 8.1 g/dL   Albumin 4.0 3.5 - 5.0 g/dL   AST 835 (H) 15 - 41 U/L   ALT 479 (H) 0 - 44 U/L   Alkaline Phosphatase 269 (H) 38 - 126 U/L   Total Bilirubin 1.8 (H) 0.3 - 1.2 mg/dL   Bilirubin, Direct 1.1 (H) 0.0 - 0.2 mg/dL   Indirect Bilirubin 0.7 0.3 - 0.9 mg/dL    Comment: Performed at [facility], [address] [place], [place] [address]  Lipase, blood     Status: Abnormal   Collection Time: 06/01/18  1:52 AM  Result Value Ref Range   Lipase 75 (H) 11 - 51 U/L    Comment: Performed at [facility], [address] [place], [place] [address]  Gamma GT     Status: Abnormal   Collection Time: 06/01/18  1:52 AM  Result Value Ref Range   GGT 371 (H) 7 - 50 U/L    Comment: Performed at [facility], [place] [address], [place], [place] [address]  TSH     Status: None   Collection Time: 06/01/18  6:07 AM  Result Value Ref Range   TSH 2.248 0.350 - 4.500 uIU/mL    Comment: Performed by a 3rd Generation assay with a functional sensitivity of <=0.01 uIU/mL. Performed at [facility], [address] [place], [place] [address]   Hemoglobin A1c     Status: None   Collection Time: 06/01/18  6:07 AM  Result Value Ref Range   Hgb A1c MFr Bld 5.6 4.8 - 5.6 %    Comment: (NOTE)         Prediabetes: 5.7 - 6.4         Diabetes: >6.4         Glycemic control for adults with diabetes: <7.0    Mean Plasma Glucose 114  mg/dL    Comment: (NOTE) Performed At: [facility] [place], [place] [id number] Rush Farmer MD QH:[id number]   Hepatic function panel     Status: Abnormal   Collection Time: 06/01/18  4:37 PM  Result Value Ref Range   Total Protein 6.8 6.5 - 8.1 g/dL   Albumin 3.7 3.5 - 5.0 g/dL   AST 701 (H) 15 - 41 U/L   ALT 592 (H) 0 - 44 U/L   Alkaline Phosphatase 269 (H) 38 - 126 U/L   Total Bilirubin 3.0 (H) 0.3 - 1.2 mg/dL   Bilirubin, Direct 1.8 (H) 0.0 - 0.2 mg/dL   Indirect Bilirubin 1.2 (H) 0.3 - 0.9 mg/dL    Comment: Performed at [facility], [address] [place], [place] [address]  Comprehensive metabolic panel     Status: Abnormal   Collection Time: 06/02/18  4:47 AM  Result Value Ref Range   Sodium 141 135 - 145 mmol/L   Potassium 4.0 3.5 - 5.1 mmol/L   Chloride 112 (H) 98 - 111 mmol/L   CO2 24 22 - 32 mmol/L   Glucose, Bld 90 70 - 99 mg/dL   BUN 11 8 - 23 mg/dL   Creatinine, Ser 0.86 0.44 - 1.00 mg/dL   Calcium 8.5 (L) 8.9 - 10.3 mg/dL   Total Protein 6.0 (L) 6.5 - 8.1 g/dL   Albumin 3.3 (L) 3.5 - 5.0 g/dL   AST 392 (H) 15 - 41 U/L   ALT 470 (H) 0 - 44 U/L   Alkaline Phosphatase 236 (H) 38 - 126 U/L   Total Bilirubin 1.4 (H) 0.3 - 1.2 mg/dL   GFR calc non Af Amer >60 >60 mL/min   GFR calc Af Amer >60 >60 mL/min    Comment: (NOTE) The eGFR has been calculated using the CKD EPI equation. This calculation has not been validated in all clinical situations. eGFR's persistently <60 mL/min signify possible Chronic Kidney Disease.    Anion gap 5 5 - 15    Comment: Performed at [facility], [address] [place], [place] [address]   Mr Abdomen Mrcp ZG Contrast  Result Date: 06/01/2018 CLINICAL DATA:  Inpatient. Cholelithiasis. Abnormal liver function tests. EXAM: MRI ABDOMEN WITHOUT CONTRAST  (INCLUDING MRCP) TECHNIQUE: Multiplanar multisequence MR imaging of the abdomen was performed. Heavily T2-weighted images of the biliary and pancreatic ducts were obtained, and  three-dimensional MRCP images were rendered by post processing. COMPARISON:  06/01/2018 abdominal sonogram. FINDINGS: Lower chest: No acute abnormality at the lung bases. Hepatobiliary: Normal liver size and configuration. No hepatic steatosis. No liver mass. Numerous gallstones fill much of the gallbladder measuring up to 1.0 cm in size. No gallbladder distention. No gallbladder wall thickening. No pericholecystic fluid. No biliary ductal dilatation. Common bile duct diameter 4 mm. No evidence of choledocholithiasis. Pancreas: No pancreatic mass or duct dilation.  No pancreas divisum. Spleen: Normal size. No mass. Adrenals/Urinary Tract: Normal adrenals. No hydronephrosis. Normal kidneys with no renal mass. Stomach/Bowel: Normal non-distended stomach. Visualized small and large bowel is normal caliber, with no bowel wall thickening. Vascular/Lymphatic: Normal caliber abdominal aorta. No pathologically enlarged lymph nodes in the abdomen. Other: No abdominal ascites or focal fluid collection. Musculoskeletal: No aggressive appearing focal osseous lesions. IMPRESSION: 1. Cholelithiasis.  No evidence of acute cholecystitis. 2. No biliary ductal dilatation. CBD diameter 4 mm. No evidence of choledocholithiasis. Electronically Signed   By: Ilona Sorrel M.D.   On: 06/01/2018 13:26   US Abdomen  Limited Ruq  Result Date: 06/01/2018 CLINICAL DATA:  Epigastric abdominal pain. EXAM: ULTRASOUND ABDOMEN LIMITED RIGHT UPPER QUADRANT COMPARISON:  None. FINDINGS: Gallbladder: Filled with stones and sludge. Mild focal gallbladder wall thickening of 3 mm. No pericholecystic fluid. No sonographic Murphy sign noted by sonographer. Common bile duct: Diameter: 6 mm, borderline. Liver: Mild central intrahepatic biliary ductal dilatation. No focal lesion identified. Heterogeneous and mildly increased in parenchymal echogenicity. Portal vein is patent on color Doppler imaging with normal direction of blood flow towards the liver.  IMPRESSION: 1. Gallbladder filled with stones and sludge with mild focal gallbladder wall thickening. No pericholecystic fluid or sonographic Murphy sign. 2. Borderline common bile duct at 6 mm with central intrahepatic biliary ductal dilatation. Consider further evaluation with MRCP or nuclear medicine HIDA scan. 3. Increased hepatic echogenicity consistent with hepatocellular disease, most commonly steatosis. Electronically Signed   By: Keith Rake M.D.   On: 06/01/2018 03:52    Assessment/Plan:  Ultrasound and MR show stones with a slightly dilated common bile duct but no sign of intraluminal filling defects to suggest choledocholithiasis.  However her liver function tests were markedly elevated and are trending down.  I see no need for an ERCP at this time and trending her labs makes poor sense at this time.  Depending on how quickly her liver function tests returned towards normal we could proceed with surgical intervention during this hospitalization or as an outpatient.  This is discussed with the patient who understood and with Dr. Darvin Neighbours.  We will continue to trend her labs for now.  Florene Glen, MD, FACS

## 2018-06-03 ENCOUNTER — Encounter: Payer: Self-pay | Admitting: Anesthesiology

## 2018-06-03 LAB — COMPREHENSIVE METABOLIC PANEL
ALBUMIN: 3.3 g/dL — AB (ref 3.5–5.0)
ALK PHOS: 216 U/L — AB (ref 38–126)
ALT: 319 U/L — ABNORMAL HIGH (ref 0–44)
ANION GAP: 5 (ref 5–15)
AST: 177 U/L — ABNORMAL HIGH (ref 15–41)
BILIRUBIN TOTAL: 0.8 mg/dL (ref 0.3–1.2)
BUN: 7 mg/dL — ABNORMAL LOW (ref 8–23)
CALCIUM: 8.4 mg/dL — AB (ref 8.9–10.3)
CO2: 23 mmol/L (ref 22–32)
Chloride: 113 mmol/L — ABNORMAL HIGH (ref 98–111)
Creatinine, Ser: 0.8 mg/dL (ref 0.44–1.00)
GFR calc Af Amer: >60 mL/min (ref >60)
GLUCOSE: 100 mg/dL — AB (ref 70–99)
Potassium: 3.6 mmol/L (ref 3.5–5.1)
Sodium: 141 mmol/L (ref 135–145)
TOTAL PROTEIN: 5.9 g/dL — AB (ref 6.5–8.1)

## 2018-06-03 LAB — SURGICAL PCR SCREEN
MRSA, PCR: NEGATIVE
STAPHYLOCOCCUS AUREUS: NEGATIVE

## 2018-06-03 MED ORDER — CEFAZOLIN SODIUM-DEXTROSE 2-4 GM/100ML-% IV SOLN
2.0000 g | INTRAVENOUS | Status: AC
  Administered 2018-06-04: 2 g via INTRAVENOUS
  Filled 2018-06-03: qty 100

## 2018-06-03 NOTE — Progress Notes (Signed)
[employer] [facility] [facility] [place] [facility]   PATIENT NAME: Kendra Adkins    MR#:  [id number]  DATE OF BIRTH:  [birth year]  SUBJECTIVE:  CHIEF COMPLAINT:   Chief Complaint  Patient presents with  . Chest Pain   No abd pain. Tolerating clear liquids.  REVIEW OF SYSTEMS:    Review of Systems  Constitutional: Positive for malaise/fatigue. Negative for chills, fever and weight loss.  HENT: Negative for hearing loss and nosebleeds.   Eyes: Negative for blurred vision, double vision and pain.  Respiratory: Negative for cough, hemoptysis, sputum production, shortness of breath and wheezing.   Cardiovascular: Negative for chest pain, palpitations, orthopnea and leg swelling.  Gastrointestinal: Negative for abdominal pain, constipation, diarrhea, nausea and vomiting.  Genitourinary: Negative for dysuria and hematuria.  Musculoskeletal: Negative for back pain, falls and myalgias.  Skin: Negative for rash.  Neurological: Positive for headaches. Negative for dizziness, tremors, sensory change, speech change, focal weakness and seizures.  Endo/Heme/Allergies: Does not bruise/bleed easily.  Psychiatric/Behavioral: Negative for depression and memory loss. The patient is not nervous/anxious.     DRUG ALLERGIES:   Allergies  Allergen Reactions  . Biaxin [Clarithromycin] Other (See Comments)    Reaction: blisters    VITALS:  Blood pressure (!) 146/78, pulse 75, temperature 98.1 F (36.7 C), temperature source Oral, resp. rate 18, height 5\' 4"  (1.626 m), weight 74.8 kg, SpO2 100 %.  PHYSICAL EXAMINATION:   Physical Exam  GENERAL:  62 y.o.-year-old patient lying in the bed with no acute distress.  EYES: Pupils equal, round, reactive to light and accommodation. No scleral icterus. Extraocular muscles intact.  HEENT: Head atraumatic, normocephalic. Oropharynx and nasopharynx clear.  NECK:  Supple, no jugular venous distention. No thyroid enlargement, no tenderness.  LUNGS: Normal  breath sounds bilaterally, no wheezing, rales, rhonchi. No use of accessory muscles of respiration.  CARDIOVASCULAR: S1, S2 normal. No murmurs, rubs, or gallops.  ABDOMEN: Soft, nontender, nondistended. Bowel sounds present. No organomegaly or mass.  EXTREMITIES: No cyanosis, clubbing or edema b/l.    NEUROLOGIC: Cranial nerves II through XII are intact. No focal Motor or sensory deficits b/l.   PSYCHIATRIC: The patient is alert and oriented x 3.  SKIN: No obvious rash, lesion, or ulcer.   LABORATORY PANEL:   CBC Recent Labs  Lab 06/01/18 0152  WBC 7.4  HGB 11.7*  HCT 35.2  PLT 373   ------------------------------------------------------------------------------------------------------------------ Chemistries  Recent Labs  Lab 06/03/18 0433  NA 141  K 3.6  CL 113*  CO2 23  GLUCOSE 100*  BUN 7*  CREATININE 0.80  CALCIUM 8.4*  AST 177*  ALT 319*  ALKPHOS 216*  BILITOT 0.8   ------------------------------------------------------------------------------------------------------------------  Cardiac Enzymes Recent Labs  Lab 06/01/18 0152  TROPONINI <0.03   ------------------------------------------------------------------------------------------------------------------  RADIOLOGY:  Mr Abdomen Mrcp Wo Contrast  Result Date: 06/01/2018 CLINICAL DATA:  Inpatient. Cholelithiasis. Abnormal liver function tests. EXAM: MRI ABDOMEN WITHOUT CONTRAST  (INCLUDING MRCP) TECHNIQUE: Multiplanar multisequence MR imaging of the abdomen was performed. Heavily T2-weighted images of the biliary and pancreatic ducts were obtained, and three-dimensional MRCP images were rendered by post processing. COMPARISON:  06/01/2018 abdominal sonogram. FINDINGS: Lower chest: No acute abnormality at the lung bases. Hepatobiliary: Normal liver size and configuration. No hepatic steatosis. No liver mass. Numerous gallstones fill much of the gallbladder measuring up to 1.0 cm in size. No gallbladder  distention. No gallbladder wall thickening. No pericholecystic fluid. No biliary ductal dilatation. Common bile duct diameter 4 mm. No evidence of  choledocholithiasis. Pancreas: No pancreatic mass or duct dilation.  No pancreas divisum. Spleen: Normal size. No mass. Adrenals/Urinary Tract: Normal adrenals. No hydronephrosis. Normal kidneys with no renal mass. Stomach/Bowel: Normal non-distended stomach. Visualized small and large bowel is normal caliber, with no bowel wall thickening. Vascular/Lymphatic: Normal caliber abdominal aorta. No pathologically enlarged lymph nodes in the abdomen. Other: No abdominal ascites or focal fluid collection. Musculoskeletal: No aggressive appearing focal osseous lesions. IMPRESSION: 1. Cholelithiasis.  No evidence of acute cholecystitis. 2. No biliary ductal dilatation. CBD diameter 4 mm. No evidence of choledocholithiasis. Electronically Signed   By: Delbert Phenix M.D.   On: 06/01/2018 13:26     ASSESSMENT AND PLAN:   * Acute gallstone pancreatitis Lipase close to normal. No abd pain . Gallbladder with multiple stones. MRCP with no CBD stone. Likely passed one Discussed with Dr. Excell Seltzer. cholecystectomy tomorrow.  * Transaminitis due to gall stone in CBD Improving  * DVT Prophylaxis Lovenox  All the records are reviewed and case discussed with Care Management/Social Worker Management plans discussed with the patient, family and they are in agreement.  CODE STATUS: FULL CODE  TOTAL TIME TAKING CARE OF THIS PATIENT: 35 minutes.   POSSIBLE D/C IN 2-3 DAYS, DEPENDING ON CLINICAL CONDITION.  Orie Fisherman M.D on 06/03/2018 at 10:10 AM  Between 7am to 6pm - Pager - [phone]  After 6pm go to [email or web address] - password [email or web address] [facility]  [facility] [place] [facility]  Office  [phone]  CC: Primary care physician; Marina Goodell, MD  Note: This dictation was prepared with Dragon dictation along with smaller phrase technology. Any transcriptional errors  that result from this process are unintentional.

## 2018-06-03 NOTE — Progress Notes (Signed)
CC: Epigastric pain Subjective: This patient with suspected choledocholithiasis which is likely passed.  Her MR was negative but her LFTs have remained elevated but are trending downwards.  Today she had minimal epigastric pain and some mild back pain.  No nausea or vomiting.  Yesterday she had a considerable migraine which has passed.  Today she feels better but is concerned about the return of her epigastric pain.  Objective: Vital signs in last 24 hours: Temp:  [98.1 F (36.7 C)-98.4 F (36.9 C)] 98.1 F (36.7 C) (09/11 0520) Pulse Rate:  [75-82] 75 (09/11 0520) Resp:  [16-20] 18 (09/11 0520) BP: (145-159)/(68-87) 146/78 (09/11 0520) SpO2:  [98 %-100 %] 100 % (09/11 0520) Last BM Date: 06/02/18  Intake/Output from previous day: 09/10 0701 - 09/11 0700 In: 3625.7 [P.O.:820; I.V.:2805.7] Out: -  Intake/Output this shift: Total I/O In: 2045.7 [P.O.:240; I.V.:1805.7] Out: -   Physical exam:  Vital signs reviewed and stable afebrile No icterus no jaundice Abdomen is soft nondistended nontympanitic minimally tender in the epigastrium.  Negative Murphy sign Nontender calves  Lab Results: CBC  Recent Labs    06/01/18 0152  WBC 7.4  HGB 11.7*  HCT 35.2  PLT 373   BMET Recent Labs    06/02/18 0447 06/03/18 0433  NA 141 141  K 4.0 3.6  CL 112* 113*  CO2 24 23  GLUCOSE 90 100*  BUN 11 7*  CREATININE 0.86 0.80  CALCIUM 8.5* 8.4*   PT/INR No results for input(s): LABPROT, INR in the last 72 hours. ABG No results for input(s): PHART, HCO3 in the last 72 hours.  Invalid input(s): PCO2, PO2  Studies/Results: Mr Abdomen Mrcp Wo Contrast  Result Date: 06/01/2018 CLINICAL DATA:  Inpatient. Cholelithiasis. Abnormal liver function tests. EXAM: MRI ABDOMEN WITHOUT CONTRAST  (INCLUDING MRCP) TECHNIQUE: Multiplanar multisequence MR imaging of the abdomen was performed. Heavily T2-weighted images of the biliary and pancreatic ducts were obtained, and three-dimensional MRCP  images were rendered by post processing. COMPARISON:  06/01/2018 abdominal sonogram. FINDINGS: Lower chest: No acute abnormality at the lung bases. Hepatobiliary: Normal liver size and configuration. No hepatic steatosis. No liver mass. Numerous gallstones fill much of the gallbladder measuring up to 1.0 cm in size. No gallbladder distention. No gallbladder wall thickening. No pericholecystic fluid. No biliary ductal dilatation. Common bile duct diameter 4 mm. No evidence of choledocholithiasis. Pancreas: No pancreatic mass or duct dilation.  No pancreas divisum. Spleen: Normal size. No mass. Adrenals/Urinary Tract: Normal adrenals. No hydronephrosis. Normal kidneys with no renal mass. Stomach/Bowel: Normal non-distended stomach. Visualized small and large bowel is normal caliber, with no bowel wall thickening. Vascular/Lymphatic: Normal caliber abdominal aorta. No pathologically enlarged lymph nodes in the abdomen. Other: No abdominal ascites or focal fluid collection. Musculoskeletal: No aggressive appearing focal osseous lesions. IMPRESSION: 1. Cholelithiasis.  No evidence of acute cholecystitis. 2. No biliary ductal dilatation. CBD diameter 4 mm. No evidence of choledocholithiasis. Electronically Signed   By: Delbert Phenix M.D.   On: 06/01/2018 13:26    Anti-infectives: Anti-infectives (From admission, onward)   None      Assessment/Plan:  LFTs are trending down but not yet normal.  Would recommend that surgical intervention in the form of a laparoscopic cholecystectomy with C arm fluoroscopic cholangiography could be likely performed tomorrow safely.  The rationale for this is been discussed the options of observation of been reviewed and the risks of bleeding infection recurrence of symptoms failure to resolve her symptoms conversion to an open procedure bile  duct damage bile duct leak retained common bile duct stone necessitating ERCP and stone extraction or the need for additional surgery were  reviewed with her.  No family was present.  She understood.  Questions were answered for her to the best my ability.  We will proceed with surgery tomorrow and preop her today pending additional testing tomorrow in the form of liver function test which will likely return towards normal by tomorrow.  Lattie Haw, MD, FACS  06/03/2018

## 2018-06-03 NOTE — Anesthesia Preprocedure Evaluation (Addendum)
Anesthesia Evaluation  Patient identified by MRN, date of birth, ID band Patient awake    Reviewed: Allergy & Precautions, NPO status , Patient's Chart, lab work & pertinent test results  History of Anesthesia Complications (+) PONV  Airway Mallampati: II       Dental   Pulmonary neg sleep apnea, neg COPD, former smoker,           Cardiovascular hypertension, Pt. on medications (-) Past MI and (-) CHF negative cardio ROS  (-) dysrhythmias (-) Valvular Problems/Murmurs     Neuro/Psych  Headaches, neg Seizures negative psych ROS   GI/Hepatic Neg liver ROS, GERD  Medicated,  Endo/Other  negative endocrine ROS  Renal/GU negative Renal ROS  Female GU complaint     Musculoskeletal negative musculoskeletal ROS (+)   Abdominal   Peds negative pediatric ROS (+)  Hematology negative hematology ROS (+)   Anesthesia Other Findings   Reproductive/Obstetrics                            Anesthesia Physical Anesthesia Plan  ASA: II  Anesthesia Plan: General   Post-op Pain Management:    Induction: Intravenous  PONV Risk Score and Plan:   Airway Management Planned: Oral ETT  Additional Equipment:   Intra-op Plan:   Post-operative Plan: Extubation in OR  Informed Consent: I have reviewed the patients History and Physical, chart, labs and discussed the procedure including the risks, benefits and alternatives for the proposed anesthesia with the patient or authorized representative who has indicated his/her understanding and acceptance.     Plan Discussed with:   Anesthesia Plan Comments:         Anesthesia Quick Evaluation

## 2018-06-04 ENCOUNTER — Encounter
Admission: EM | Disposition: A | Discharge status: Home / Self Care | Payer: Self-pay | Source: Home / Self Care | Attending: Internal Medicine

## 2018-06-04 ENCOUNTER — Inpatient Hospital Stay: Payer: BC Managed Care – PPO | Admitting: Anesthesiology

## 2018-06-04 ENCOUNTER — Inpatient Hospital Stay: Payer: BC Managed Care – PPO

## 2018-06-04 ENCOUNTER — Encounter: Payer: Self-pay | Admitting: *Deleted

## 2018-06-04 HISTORY — PX: CHOLECYSTECTOMY: SHX55

## 2018-06-04 LAB — CBC WITH DIFFERENTIAL/PLATELET
BASOS ABS: 0.1 10*3/uL (ref 0–0.1)
Basophils Relative: 1 %
EOS ABS: 1 10*3/uL — AB (ref 0–0.7)
EOS PCT: 11 %
HCT: 30.3 % — ABNORMAL LOW (ref 35.0–47.0)
Hemoglobin: 10.3 g/dL — ABNORMAL LOW (ref 12.0–16.0)
LYMPHS PCT: 22 %
Lymphs Abs: 2 10*3/uL (ref 1.0–3.6)
MCH: 28.9 pg (ref 26.0–34.0)
MCHC: 33.9 g/dL (ref 32.0–36.0)
MCV: 85.2 fL (ref 80.0–100.0)
Monocytes Absolute: 0.9 10*3/uL (ref 0.2–0.9)
Monocytes Relative: 10 %
Neutro Abs: 5.1 10*3/uL (ref 1.4–6.5)
Neutrophils Relative %: 56 %
PLATELETS: 312 10*3/uL (ref 150–440)
RBC: 3.56 MIL/uL — AB (ref 3.80–5.20)
RDW: 15.6 % — ABNORMAL HIGH (ref 11.5–14.5)
WBC: 9.2 10*3/uL (ref 3.6–11.0)

## 2018-06-04 LAB — COMPREHENSIVE METABOLIC PANEL
ALBUMIN: 3.4 g/dL — AB (ref 3.5–5.0)
ALT: 263 U/L — AB (ref 0–44)
AST: 104 U/L — AB (ref 15–41)
Alkaline Phosphatase: 215 U/L — ABNORMAL HIGH (ref 38–126)
Anion gap: 4 — ABNORMAL LOW (ref 5–15)
BUN: 5 mg/dL — AB (ref 8–23)
CHLORIDE: 113 mmol/L — AB (ref 98–111)
CO2: 27 mmol/L (ref 22–32)
CREATININE: 0.94 mg/dL (ref 0.44–1.00)
Calcium: 9.1 mg/dL (ref 8.9–10.3)
GFR calc Af Amer: >60 mL/min (ref >60)
GFR calc non Af Amer: >60 mL/min (ref >60)
Glucose, Bld: 104 mg/dL — ABNORMAL HIGH (ref 70–99)
Potassium: 3.7 mmol/L (ref 3.5–5.1)
SODIUM: 144 mmol/L (ref 135–145)
Total Bilirubin: 0.7 mg/dL (ref 0.3–1.2)
Total Protein: 6 g/dL — ABNORMAL LOW (ref 6.5–8.1)

## 2018-06-04 SURGERY — LAPAROSCOPIC CHOLECYSTECTOMY WITH INTRAOPERATIVE CHOLANGIOGRAM
Anesthesia: General

## 2018-06-04 MED ORDER — SUGAMMADEX SODIUM 200 MG/2ML IV SOLN
INTRAVENOUS | Status: DC | PRN
  Administered 2018-06-04: 150 mg via INTRAVENOUS

## 2018-06-04 MED ORDER — FENTANYL CITRATE (PF) 100 MCG/2ML IJ SOLN
25.0000 ug | INTRAMUSCULAR | Status: DC | PRN
  Administered 2018-06-04 (×4): 25 ug via INTRAVENOUS

## 2018-06-04 MED ORDER — DEXAMETHASONE SODIUM PHOSPHATE 10 MG/ML IJ SOLN
INTRAMUSCULAR | Status: AC
  Filled 2018-06-04: qty 1

## 2018-06-04 MED ORDER — BUPIVACAINE-EPINEPHRINE (PF) 0.25% -1:200000 IJ SOLN
INTRAMUSCULAR | Status: AC
  Filled 2018-06-04: qty 30

## 2018-06-04 MED ORDER — CEFAZOLIN SODIUM-DEXTROSE 2-4 GM/100ML-% IV SOLN
INTRAVENOUS | Status: AC
  Filled 2018-06-04: qty 100

## 2018-06-04 MED ORDER — PHENYLEPHRINE HCL 10 MG/ML IJ SOLN
INTRAMUSCULAR | Status: DC | PRN
  Administered 2018-06-04: 100 ug via INTRAVENOUS

## 2018-06-04 MED ORDER — ACETAMINOPHEN 10 MG/ML IV SOLN
INTRAVENOUS | Status: AC
  Filled 2018-06-04: qty 100

## 2018-06-04 MED ORDER — DEXAMETHASONE SODIUM PHOSPHATE 10 MG/ML IJ SOLN
INTRAMUSCULAR | Status: DC | PRN
  Administered 2018-06-04: 5 mg via INTRAVENOUS

## 2018-06-04 MED ORDER — ROCURONIUM BROMIDE 50 MG/5ML IV SOLN
INTRAVENOUS | Status: AC
  Filled 2018-06-04: qty 1

## 2018-06-04 MED ORDER — PROPOFOL 10 MG/ML IV BOLUS
INTRAVENOUS | Status: AC
  Filled 2018-06-04: qty 20

## 2018-06-04 MED ORDER — SCOPOLAMINE 1 MG/3DAYS TD PT72
1.0000 | MEDICATED_PATCH | Freq: Once | TRANSDERMAL | Status: DC
  Administered 2018-06-04: 1.5 mg via TRANSDERMAL

## 2018-06-04 MED ORDER — HYDROCODONE-ACETAMINOPHEN 5-325 MG PO TABS: 1.0000 | ORAL_TABLET | ORAL | Status: DC | PRN

## 2018-06-04 MED ORDER — LACTATED RINGERS IV SOLN
INTRAVENOUS | Status: DC | PRN
  Administered 2018-06-04: 12:00:00 via INTRAVENOUS

## 2018-06-04 MED ORDER — FENTANYL CITRATE (PF) 100 MCG/2ML IJ SOLN
INTRAMUSCULAR | Status: AC
  Administered 2018-06-04: 25 ug via INTRAVENOUS
  Filled 2018-06-04: qty 2

## 2018-06-04 MED ORDER — FENTANYL CITRATE (PF) 100 MCG/2ML IJ SOLN
INTRAMUSCULAR | Status: DC | PRN
  Administered 2018-06-04: 100 ug via INTRAVENOUS
  Administered 2018-06-04: 50 ug via INTRAVENOUS
  Administered 2018-06-04: 100 ug via INTRAVENOUS

## 2018-06-04 MED ORDER — ONDANSETRON HCL 4 MG/2ML IJ SOLN
INTRAMUSCULAR | Status: AC
  Filled 2018-06-04: qty 2

## 2018-06-04 MED ORDER — ROCURONIUM BROMIDE 100 MG/10ML IV SOLN
INTRAVENOUS | Status: DC | PRN
  Administered 2018-06-04: 10 mg via INTRAVENOUS
  Administered 2018-06-04: 30 mg via INTRAVENOUS

## 2018-06-04 MED ORDER — BUPIVACAINE-EPINEPHRINE (PF) 0.25% -1:200000 IJ SOLN
INTRAMUSCULAR | Status: DC | PRN
  Administered 2018-06-04: 30 mL

## 2018-06-04 MED ORDER — MIDAZOLAM HCL 2 MG/2ML IJ SOLN
INTRAMUSCULAR | Status: AC
  Filled 2018-06-04: qty 2

## 2018-06-04 MED ORDER — ONDANSETRON HCL 4 MG/2ML IJ SOLN: 4.0000 mg | Freq: Once | INTRAMUSCULAR | Status: DC | PRN

## 2018-06-04 MED ORDER — LIDOCAINE HCL (PF) 2 % IJ SOLN
INTRAMUSCULAR | Status: AC
  Filled 2018-06-04: qty 10

## 2018-06-04 MED ORDER — PROPOFOL 10 MG/ML IV BOLUS
INTRAVENOUS | Status: DC | PRN
  Administered 2018-06-04: 150 mg via INTRAVENOUS

## 2018-06-04 MED ORDER — ACETAMINOPHEN 10 MG/ML IV SOLN
INTRAVENOUS | Status: DC | PRN
  Administered 2018-06-04: 1000 mg via INTRAVENOUS

## 2018-06-04 MED ORDER — SCOPOLAMINE 1 MG/3DAYS TD PT72
MEDICATED_PATCH | TRANSDERMAL | Status: AC
  Filled 2018-06-04: qty 1

## 2018-06-04 MED ORDER — MIDAZOLAM HCL 2 MG/2ML IJ SOLN
INTRAMUSCULAR | Status: DC | PRN
  Administered 2018-06-04: 2 mg via INTRAVENOUS

## 2018-06-04 MED ORDER — HYDROMORPHONE HCL 1 MG/ML IJ SOLN
0.5000 mg | INTRAMUSCULAR | Status: DC | PRN
  Administered 2018-06-04: 0.5 mg via INTRAVENOUS
  Filled 2018-06-04: qty 0.5

## 2018-06-04 MED ORDER — IOTHALAMATE MEGLUMINE 60 % INJ SOLN
INTRAMUSCULAR | Status: DC | PRN
  Administered 2018-06-04: 6 mL

## 2018-06-04 MED ORDER — SUCCINYLCHOLINE CHLORIDE 20 MG/ML IJ SOLN
INTRAMUSCULAR | Status: AC
  Filled 2018-06-04: qty 1

## 2018-06-04 MED ORDER — SODIUM CHLORIDE FLUSH 0.9 % IV SOLN
INTRAVENOUS | Status: AC
  Filled 2018-06-04: qty 10

## 2018-06-04 MED ORDER — LIDOCAINE HCL (CARDIAC) PF 100 MG/5ML IV SOSY
PREFILLED_SYRINGE | INTRAVENOUS | Status: DC | PRN
  Administered 2018-06-04: 100 mg via INTRAVENOUS

## 2018-06-04 MED ORDER — ONDANSETRON HCL 4 MG/2ML IJ SOLN
INTRAMUSCULAR | Status: DC | PRN
  Administered 2018-06-04: 4 mg via INTRAVENOUS

## 2018-06-04 MED ORDER — FENTANYL CITRATE (PF) 250 MCG/5ML IJ SOLN
INTRAMUSCULAR | Status: AC
  Filled 2018-06-04: qty 5

## 2018-06-04 SURGICAL SUPPLY — 41 items
ADHESIVE MASTISOL STRL (MISCELLANEOUS) ×2 IMPLANT
APPLIER CLIP ROT 10 11.4 M/L (STAPLE) ×2
BLADE SURG SZ11 CARB STEEL (BLADE) ×2 IMPLANT
CANISTER SUCT 1200ML W/VALVE (MISCELLANEOUS) ×2 IMPLANT
CATH CHOLANGI 4FR 420404F (CATHETERS) IMPLANT
CHLORAPREP W/TINT 26ML (MISCELLANEOUS) ×2 IMPLANT
CLIP APPLIE ROT 10 11.4 M/L (STAPLE) ×1 IMPLANT
CONRAY 60ML FOR OR (MISCELLANEOUS) IMPLANT
DRAPE C-ARM XRAY 36X54 (DRAPES) IMPLANT
ELECT REM PT RETURN 9FT ADLT (ELECTROSURGICAL) ×2
ELECTRODE REM PT RTRN 9FT ADLT (ELECTROSURGICAL) ×1 IMPLANT
GLOVE BIO SURGEON STRL SZ8 (GLOVE) ×2 IMPLANT
GOWN STRL REUS W/ TWL LRG LVL3 (GOWN DISPOSABLE) ×4 IMPLANT
GOWN STRL REUS W/TWL LRG LVL3 (GOWN DISPOSABLE) ×4
IRRIGATION STRYKERFLOW (MISCELLANEOUS) ×1 IMPLANT
IRRIGATOR STRYKERFLOW (MISCELLANEOUS) ×2
IV CATH ANGIO 12GX3 LT BLUE (NEEDLE) ×2 IMPLANT
IV NS 1000ML (IV SOLUTION) ×1
IV NS 1000ML BAXH (IV SOLUTION) ×1 IMPLANT
JACKSON PRATT 10 (INSTRUMENTS) IMPLANT
KIT TURNOVER KIT A (KITS) ×2 IMPLANT
LABEL OR SOLS (LABEL) ×2 IMPLANT
NEEDLE HYPO 22GX1.5 SAFETY (NEEDLE) ×2 IMPLANT
NEEDLE VERESS 14GA 120MM (NEEDLE) ×2 IMPLANT
NS IRRIG 500ML POUR BTL (IV SOLUTION) ×2 IMPLANT
PACK LAP CHOLECYSTECTOMY (MISCELLANEOUS) ×2 IMPLANT
POUCH SPECIMEN RETRIEVAL 10MM (ENDOMECHANICALS) ×2 IMPLANT
SCISSORS METZENBAUM CVD 33 (INSTRUMENTS) ×2 IMPLANT
SLEEVE ENDOPATH XCEL 5M (ENDOMECHANICALS) ×4 IMPLANT
SPONGE GAUZE 2X2 8PLY STRL LF (GAUZE/BANDAGES/DRESSINGS) ×8 IMPLANT
SPONGE LAP 18X18 RF (DISPOSABLE) ×2 IMPLANT
SPONGE VERSALON 4X4 4PLY (MISCELLANEOUS) IMPLANT
STRIP CLOSURE SKIN 1/2X4 (GAUZE/BANDAGES/DRESSINGS) ×2 IMPLANT
SUT MNCRL 4-0 (SUTURE) ×1
SUT MNCRL 4-0 27XMFL (SUTURE) ×1
SUT VICRYL 0 AB UR-6 (SUTURE) ×2 IMPLANT
SUTURE MNCRL 4-0 27XMF (SUTURE) ×1 IMPLANT
SYR 20CC LL (SYRINGE) ×2 IMPLANT
TROCAR XCEL NON-BLD 11X100MML (ENDOMECHANICALS) ×2 IMPLANT
TROCAR XCEL NON-BLD 5MMX100MML (ENDOMECHANICALS) ×2 IMPLANT
TUBING INSUFFLATION (TUBING) ×2 IMPLANT

## 2018-06-04 NOTE — Transfer of Care (Signed)
Immediate Anesthesia Transfer of Care Note  Patient: Kendra RombergLaura R Adkins  Procedure(s) Performed: LAPAROSCOPIC CHOLECYSTECTOMY WITH INTRAOPERATIVE CHOLANGIOGRAM (N/A )  Patient Location: PACU  Anesthesia Type:General  Level of Consciousness: awake, alert  and oriented  Airway & Oxygen Therapy: Patient Spontanous Breathing and Patient connected to face mask oxygen  Post-op Assessment: Report given to RN and Post -op Vital signs reviewed and stable  Post vital signs: Reviewed and stable  Last Vitals:  Vitals Value Taken Time  BP 140/64 06/04/2018 12:49 PM  Temp 36.8 C 06/04/2018 12:49 PM  Pulse 103 06/04/2018 12:51 PM  Resp 20 06/04/2018 12:51 PM  SpO2 100 % 06/04/2018 12:51 PM  Vitals shown include unvalidated device data.  Last Pain:  Vitals:   06/04/18 1108  TempSrc: Tympanic  PainSc: 3       Patients Stated Pain Goal: 0 (06/03/18 2200)  Complications: No apparent anesthesia complications

## 2018-06-04 NOTE — Anesthesia Procedure Notes (Signed)
Procedure Name: Intubation Date/Time: 06/04/2018 11:49 AM Performed by: Jonna Clark, CRNA Pre-anesthesia Checklist: Patient identified, Patient being monitored, Timeout performed, Emergency Drugs available and Suction available Patient Re-evaluated:Patient Re-evaluated prior to induction Oxygen Delivery Method: Circle system utilized Preoxygenation: Pre-oxygenation with 100% oxygen Induction Type: IV induction Ventilation: Mask ventilation without difficulty Laryngoscope Size: Mac and 3 Grade View: Grade I Tube type: Oral Tube size: 7.0 mm Number of attempts: 1 Airway Equipment and Method: Stylet Placement Confirmation: ETT inserted through vocal cords under direct vision,  positive ETCO2 and breath sounds checked- equal and bilateral Secured at: 22 cm Tube secured with: Tape Dental Injury: Teeth and Oropharynx as per pre-operative assessment

## 2018-06-04 NOTE — Progress Notes (Signed)
[employer] - [place] [facility]   PATIENT NAME: Dena BilletLaura Gruhn    MR#:  [id number]  DATE OF BIRTH:  [birth year]  SUBJECTIVE:  CHIEF COMPLAINT:   Chief Complaint  Patient presents with  . Chest Pain   No abd pain. No nausea. Waiting for surgery  REVIEW OF SYSTEMS:    Review of Systems  Constitutional: Positive for malaise/fatigue. Negative for chills, fever and weight loss.  HENT: Negative for hearing loss and nosebleeds.   Eyes: Negative for blurred vision, double vision and pain.  Respiratory: Negative for cough, hemoptysis, sputum production, shortness of breath and wheezing.   Cardiovascular: Negative for chest pain, palpitations, orthopnea and leg swelling.  Gastrointestinal: Negative for abdominal pain, constipation, diarrhea, nausea and vomiting.  Genitourinary: Negative for dysuria and hematuria.  Musculoskeletal: Negative for back pain, falls and myalgias.  Skin: Negative for rash.  Neurological: Positive for headaches. Negative for dizziness, tremors, sensory change, speech change, focal weakness and seizures.  Endo/Heme/Allergies: Does not bruise/bleed easily.  Psychiatric/Behavioral: Negative for depression and memory loss. The patient is not nervous/anxious.     DRUG ALLERGIES:   Allergies  Allergen Reactions  . Biaxin [Clarithromycin] Other (See Comments)    Reaction: blisters    VITALS:  Blood pressure (!) 169/82, pulse 88, temperature 98.4 F (36.9 C), temperature source Tympanic, resp. rate 18, height 5\' 4"  (1.626 m), weight 75.4 kg, SpO2 98 %.  PHYSICAL EXAMINATION:   Physical Exam  GENERAL:  62 y.o.-year-old patient lying in the bed with no acute distress.  EYES: Pupils equal, round, reactive to light and accommodation. No scleral icterus. Extraocular muscles intact.  HEENT: Head atraumatic, normocephalic. Oropharynx and nasopharynx clear.  NECK:  Supple, no jugular venous distention. No thyroid enlargement, no tenderness.   LUNGS: Normal breath sounds bilaterally, no wheezing, rales, rhonchi. No use of accessory muscles of respiration.  CARDIOVASCULAR: S1, S2 normal. No murmurs, rubs, or gallops.  ABDOMEN: Soft, nontender, nondistended. Bowel sounds present. No organomegaly or mass.  EXTREMITIES: No cyanosis, clubbing or edema b/l.    NEUROLOGIC: Cranial nerves II through XII are intact. No focal Motor or sensory deficits b/l.   PSYCHIATRIC: The patient is alert and oriented x 3.  SKIN: No obvious rash, lesion, or ulcer.   LABORATORY PANEL:   CBC Recent Labs  Lab 06/04/18 0327  WBC 9.2  HGB 10.3*  HCT 30.3*  PLT 312   ------------------------------------------------------------------------------------------------------------------ Chemistries  Recent Labs  Lab 06/04/18 0327  NA 144  K 3.7  CL 113*  CO2 27  GLUCOSE 104*  BUN 5*  CREATININE 0.94  CALCIUM 9.1  AST 104*  ALT 263*  ALKPHOS 215*  BILITOT 0.7   ------------------------------------------------------------------------------------------------------------------  Cardiac Enzymes Recent Labs  Lab 06/01/18 0152  TROPONINI <0.03   ------------------------------------------------------------------------------------------------------------------  RADIOLOGY:  No results found.   ASSESSMENT AND PLAN:   * Acute gallstone pancreatitis Lipase close to normal. No abd pain . Gallbladder with multiple stones. MRCP with no CBD stone. Likely passed one Discussed with Dr. Excell Seltzerooper. cholecystectomy today.  * Transaminitis due to gall stone in CBD Improving  * DVT Prophylaxis Lovenox  All the records are reviewed and case discussed with Care Management/Social Worker Management plans discussed with the patient, family and they are in agreement.  CODE STATUS: FULL CODE  TOTAL TIME TAKING CARE OF THIS PATIENT: 35 minutes.   POSSIBLE D/C IN 1-2 DAYS, DEPENDING ON CLINICAL CONDITION.  Orie FishermanSrikar R Darryll Raju M.D on 06/04/2018 at 11:35  AM  Between  7am to 6pm - Pager - [phone]  After 6pm go to [email or web address] - password [email or web address] [facility]  [facility]  Office  [phone]  CC: Primary care physician; Marina Goodell, MD  Note: This dictation was prepared with Dragon dictation along with smaller phrase technology. Any transcriptional errors that result from this process are unintentional.

## 2018-06-04 NOTE — Progress Notes (Signed)
Preoperative Review   Patient is met in the preoperative holding area. The history is reviewed in the chart and with the patient. I personally reviewed the options and rationale as well as the risks of this procedure that have been previously discussed with the patient. All questions asked by the patient and/or family were answered to their satisfaction.  Patient agrees to proceed with this procedure at this time.  Jaliza Seifried E Donnah Levert M.D. FACS  

## 2018-06-04 NOTE — Anesthesia Post-op Follow-up Note (Signed)
Anesthesia QCDR form completed.        

## 2018-06-04 NOTE — Op Note (Signed)
Laparoscopic Cholecystectomy  Pre-operative Diagnosis: Choledocholithiasis  Post-operative Diagnosis: Same  Procedure: Laparoscopic cholecystectomy with C arm fluoroscopic cholangiography  Surgeon: Adah Salvage. Excell Seltzer, MD FACS  Anesthesia: Gen. with endotracheal tube  Assistant: Surgical tech  Procedure Details  The patient was seen again in the Holding Room. The benefits, complications, treatment options, and expected outcomes were discussed with the patient. The risks of bleeding, infection, recurrence of symptoms, failure to resolve symptoms, bile duct damage, bile duct leak, retained common bile duct stone, bowel injury, any of which could require further surgery and/or ERCP, stent, or papillotomy were reviewed with the patient. The likelihood of improving the patient's symptoms with return to their baseline status is good.  The patient and/or family concurred with the proposed plan, giving informed consent.  The patient was taken to Operating Room, identified as JAICEY SWEANEY and the procedure verified as Laparoscopic Cholecystectomy.  A Time Out was held and the above information confirmed.  Prior to the induction of general anesthesia, antibiotic prophylaxis was administered. VTE prophylaxis was in place. General endotracheal anesthesia was then administered and tolerated well. After the induction, the abdomen was prepped with Chloraprep and draped in the sterile fashion. The patient was positioned in the supine position.  Local anesthetic  was injected into the skin near the umbilicus and an incision made. The Veress needle was placed. Pneumoperitoneum was then created with CO2 and tolerated well without any adverse changes in the patient's vital signs. A 5mm port was placed in the periumbilical position and the abdominal cavity was explored.  Two 5-mm ports were placed in the right upper quadrant and a 12 mm epigastric port was placed all under direct vision. All skin incisions  were  infiltrated with a local anesthetic agent before making the incision and placing the trocars.   The patient was positioned  in reverse Trendelenburg, tilted slightly to the patient's left.  The gallbladder was identified, the fundus grasped and retracted cephalad. Adhesions were lysed bluntly. The infundibulum was grasped and retracted laterally, exposing the peritoneum overlying the triangle of Calot. This was then divided and exposed in a blunt fashion.  Cystic lymphatics were doubly clipped and divided.  A critical view of the cystic duct and cystic artery was obtained.  The cystic duct was clearly identified and bluntly dissected.   The cystic duct was dissected clipped and incised in through a separate incision and Angiocath a cholangiogram catheter was placed.  C-arm fluoroscopic cholangiography demonstrated good flow into the duodenum without intraluminal filling defects proximal ducts were well identified.  The cystic duct was quite long and spiral and tortuous.  Had been properly cannulated.  The cystic duct catheter was then removed and the cystic duct was doubly clipped and divided.  The cystic artery was doubly clipped in 2 branches and divided.  The gallbladder was taken from the gallbladder fossa in a retrograde fashion with the electrocautery. The gallbladder was removed and placed in an Endocatch bag. The liver bed was irrigated and inspected. Hemostasis was achieved with the electrocautery.  The lateral peritoneal reflection had a venous structure on it that was bleeding in multiple locations and clips were utilized to control this.  A piece of Surgicel was placed over it as well adequately controlling the minor hemorrhage.  Copious irrigation was utilized and was repeatedly aspirated until clear.  The gallbladder and Endocatch sac were then removed through the epigastric port site.   Inspection of the right upper quadrant was performed. No bleeding, bile duct  injury or leak, or bowel  injury was noted. Pneumoperitoneum was released.  The epigastric port site was closed with figure-of-eight 0 Vicryl sutures. 4-0 subcuticular Monocryl was used to close the skin. Steristrips and Mastisol and sterile dressings were  applied.  The patient was then extubated and brought to the recovery room in stable condition. Sponge, lap, and needle counts were correct at closure and at the conclusion of the case.   Findings: Chronic cholecystitis with normal cholangiograms demonstrating no sign of active choledocholithiasis.  Multiple small gallstones are present in the gallbladder.  Estimated Blood Loss: 2 cc         Drains: None         Specimens: Gallbladder           Complications: none               Ed Rayson E. Excell Seltzerooper, MD, FACS

## 2018-06-04 NOTE — Anesthesia Postprocedure Evaluation (Signed)
Anesthesia Post Note  Patient: Kendra Adkins  Procedure(s) Performed: LAPAROSCOPIC CHOLECYSTECTOMY WITH INTRAOPERATIVE CHOLANGIOGRAM (N/A )  Patient location during evaluation: PACU Anesthesia Type: General Level of consciousness: awake and alert Pain management: pain level controlled Vital Signs Assessment: post-procedure vital signs reviewed and stable Respiratory status: spontaneous breathing and respiratory function stable Cardiovascular status: stable Anesthetic complications: no     Last Vitals:  Vitals:   06/04/18 1108 06/04/18 1249  BP: (!) 169/82 140/64  Pulse: 88 100  Resp: 18 10  Temp: 36.9 C 36.8 C  SpO2: 98% 100%    Last Pain:  Vitals:   06/04/18 1108  TempSrc: Tympanic  PainSc: 3                  KEPHART,WILLIAM K

## 2018-06-05 LAB — COMPREHENSIVE METABOLIC PANEL
ALBUMIN: 3.4 g/dL — AB (ref 3.5–5.0)
ALK PHOS: 191 U/L — AB (ref 38–126)
ALT: 205 U/L — AB (ref 0–44)
ANION GAP: 7 (ref 5–15)
AST: 90 U/L — AB (ref 15–41)
BILIRUBIN TOTAL: 0.5 mg/dL (ref 0.3–1.2)
BUN: 6 mg/dL — AB (ref 8–23)
CALCIUM: 9.1 mg/dL (ref 8.9–10.3)
CO2: 26 mmol/L (ref 22–32)
CREATININE: 0.96 mg/dL (ref 0.44–1.00)
Chloride: 107 mmol/L (ref 98–111)
GFR calc Af Amer: >60 mL/min (ref >60)
GFR calc non Af Amer: >60 mL/min (ref >60)
GLUCOSE: 122 mg/dL — AB (ref 70–99)
Potassium: 3.4 mmol/L — ABNORMAL LOW (ref 3.5–5.1)
SODIUM: 140 mmol/L (ref 135–145)
TOTAL PROTEIN: 6.3 g/dL — AB (ref 6.5–8.1)

## 2018-06-05 LAB — CBC WITH DIFFERENTIAL/PLATELET
BASOS ABS: 0 10*3/uL (ref 0–0.1)
BASOS PCT: 0 %
EOS PCT: 0 %
Eosinophils Absolute: 0 10*3/uL (ref 0–0.7)
HEMATOCRIT: 29.6 % — AB (ref 35.0–47.0)
Hemoglobin: 9.6 g/dL — ABNORMAL LOW (ref 12.0–16.0)
Lymphocytes Relative: 13 %
Lymphs Abs: 1.5 10*3/uL (ref 1.0–3.6)
MCH: 27.6 pg (ref 26.0–34.0)
MCHC: 32.4 g/dL (ref 32.0–36.0)
MCV: 85.4 fL (ref 80.0–100.0)
MONO ABS: 1.6 10*3/uL — AB (ref 0.2–0.9)
MONOS PCT: 13 %
NEUTROS ABS: 9.1 10*3/uL — AB (ref 1.4–6.5)
Neutrophils Relative %: 74 %
Platelets: 311 10*3/uL (ref 150–440)
RBC: 3.46 MIL/uL — ABNORMAL LOW (ref 3.80–5.20)
RDW: 16 % — AB (ref 11.5–14.5)
WBC: 12.2 10*3/uL — ABNORMAL HIGH (ref 3.6–11.0)

## 2018-06-05 LAB — LIPASE, BLOOD: Lipase: 30 U/L (ref 11–51)

## 2018-06-05 LAB — SURGICAL PATHOLOGY

## 2018-06-05 MED ORDER — ONDANSETRON 4 MG PO TBDP: 4.0000 mg | ORAL_TABLET | Freq: Three times a day (TID) | ORAL | 0 refills | Status: DC | PRN

## 2018-06-05 MED ORDER — AMOXICILLIN-POT CLAVULANATE 875-125 MG PO TABS: 1.0000 | ORAL_TABLET | Freq: Two times a day (BID) | ORAL | 0 refills | Status: AC

## 2018-06-05 MED ORDER — INFLUENZA VAC SPLIT QUAD 0.5 ML IM SUSY
0.5000 mL | PREFILLED_SYRINGE | Freq: Once | INTRAMUSCULAR | Status: AC
  Administered 2018-06-05: 0.5 mL via INTRAMUSCULAR
  Filled 2018-06-05: qty 0.5

## 2018-06-05 MED ORDER — HYDROCODONE-ACETAMINOPHEN 5-325 MG PO TABS: 1.0000 | ORAL_TABLET | ORAL | 0 refills | Status: DC | PRN

## 2018-06-05 MED ORDER — INFLUENZA VAC SPLIT QUAD 0.5 ML IM SUSY: 0.5000 mL | PREFILLED_SYRINGE | INTRAMUSCULAR | Status: DC

## 2018-06-05 NOTE — Discharge Summary (Signed)
Kendra Adkins, is a 62 y.o. female  DOB 16-Jan-1956  MRN [id number].  Admission date:  06/01/2018  Admitting Physician  Arnaldo Natal, MD  Discharge Date:  06/05/2018   Primary MD  Marina Goodell, MD  Recommendations for primary care physician for things to follow:   Follow-up with PCP in 1 week Follow-up with Dr. Excell Seltzer in 6 days   Admission Diagnosis  Biliary colic [K80.50] Choledocholithiasis [K80.50] Pain [R52]   Discharge Diagnosis  Biliary colic [K80.50] Choledocholithiasis [K80.50] Pain [R52]   Active Problems:   Transaminitis   Biliary colic   Choledocholithiasis      Past Medical History:  Diagnosis Date  . Diverticula of colon   . Endometriosis   . Head ache   . History of migraine headaches   . Increased BMI   . Insomnia   . Menopause     Past Surgical History:  Procedure Laterality Date  . CHOLECYSTECTOMY N/A 06/04/2018   Procedure: LAPAROSCOPIC CHOLECYSTECTOMY WITH INTRAOPERATIVE CHOLANGIOGRAM;  Surgeon: Lattie Haw, MD;  Location: [facility] ORS;  Service: General;  Laterality: N/A;       History of present illness and  Hospital Course:     Kindly see H&P for history of present illness and admission details, please review complete Labs, Consult reports and Test reports for all details in brief  HPI  from the history and physical done on the day of admission 62 year old female patient admitted on September 9 for right upper quadrant pain and heartburn and nausea.  Initially presented like chest pain but later on found to have transaminitis, abdominal ultrasound showed steatosis, but dilated bile ducts.   Hospital Course  #1 transaminitis,; treated with conservative management patient had gallstone pancreatitis, MRCP showed no CBD stone or gallbladder showed multiple stones.  On  admission patient LFTs were elevated to AST 835, ALT 479, total bilirubin 1.8, lipase 75, patient continues to have elevated LFTs,, patient had lap chole by Dr. Excell Seltzer yesterday on September 12, today patient feels much better, LFTs are also improved, patient AST 90, ALT is 205 today.  Total bilirubin 0.5.  Patient feels much better, stable for discharge her gallstone pancreatitis resolved patient had cholecystectomy.  Patient will follow with Dr. Excell Seltzer in 6 days.  Because of slightly elevated white count up to 12.2 discharging her with Augmentin for 5 days.  Patient also is given prescription for short course of Percocet and also Zofran.  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 2.  Essential hypertension: Controlled, continue present blood pressure medication with lisinopril. Depression: Continue amitriptyline.  Discussed the plan with patient and patient husband in detail. 4.  Mild hypokalemia:advised her to eat banana.  Discharge Condition: Stable   Follow UP  Follow-up Information    Lattie Haw, MD Follow up in 6 day(s).   Specialty:  General Surgery Contact information: [address] [place] [address] [phone]        Marina Goodell, MD. Schedule an appointment as soon as possible for a visit in 1 week(s).   Specialty:  Family Medicine Contact information: [address] [place] [address] [phone]             Discharge Instructions  and  Discharge Medications     Allergies as  of 06/05/2018      Reactions   Biaxin [clarithromycin] Other (See Comments)   Reaction: blisters      Medication List    TAKE these medications   amitriptyline 10 MG tablet Commonly known as:  ELAVIL Take 10 mg by mouth at bedtime.   amoxicillin-clavulanate 875-125 MG tablet Commonly known as:   AUGMENTIN Take 1 tablet by mouth 2 (two) times daily for 5 days.   HYDROcodone-acetaminophen 5-325 MG tablet Commonly known as:  NORCO/VICODIN Take 1 tablet by mouth every 4 (four) hours as needed for moderate pain.   lisinopril 10 MG tablet Commonly known as:  PRINIVIL,ZESTRIL Take 10 mg by mouth daily.   ondansetron 4 MG disintegrating tablet Commonly known as:  ZOFRAN-ODT Take 1 tablet (4 mg total) by mouth every 8 (eight) hours as needed for nausea or vomiting.         Diet and Activity recommendation: See Discharge Instructions above   Consults obtained -GI, surgery   Major procedures and Radiology Reports - PLEASE review detailed and final reports for all details, in brief -      Dg Cholangiogram Operative  Result Date: 06/04/2018 CLINICAL DATA:  62 year old female with a history of cholelithiasis EXAM: INTRAOPERATIVE CHOLANGIOGRAM TECHNIQUE: Cholangiographic images from the C-arm fluoroscopic device were submitted for interpretation post-operatively. Please see the procedural report for the amount of contrast and the fluoroscopy time utilized. COMPARISON:  MR 06/01/2018, ultrasound 06/01/2018 FINDINGS: Surgical instruments project over the upper abdomen. There is cannulation of the cystic duct/gallbladder neck, with antegrade infusion of contrast. Caliber of the extrahepatic ductal system within normal limits. No definite filling defect within the extrahepatic ducts identified. Free flow of contrast across the ampulla. IMPRESSION: Intraoperative cholangiogram demonstrates extrahepatic biliary ducts of unremarkable caliber, with no definite filling defects identified. Free flow of contrast across the ampulla. Please refer to the dictated operative report for full details of intraoperative findings and procedure Electronically Signed   By: Gilmer Mor D.O.   On: 06/04/2018 13:24   Mr Abdomen Mrcp Wo Contrast  Result Date: 06/01/2018 CLINICAL DATA:  Inpatient.  Cholelithiasis. Abnormal liver function tests. EXAM: MRI ABDOMEN WITHOUT CONTRAST  (INCLUDING MRCP) TECHNIQUE: Multiplanar multisequence MR imaging of the abdomen was performed. Heavily T2-weighted images of the biliary and pancreatic ducts were obtained, and three-dimensional MRCP images were rendered by post processing. COMPARISON:  06/01/2018 abdominal sonogram. FINDINGS: Lower chest: No acute abnormality at the lung bases. Hepatobiliary: Normal liver size and configuration. No hepatic steatosis. No liver mass. Numerous gallstones fill much of the gallbladder measuring up to 1.0 cm in size. No gallbladder distention. No gallbladder wall thickening. No pericholecystic fluid. No biliary ductal dilatation. Common bile duct diameter 4 mm. No evidence of choledocholithiasis. Pancreas: No pancreatic mass or duct dilation.  No pancreas divisum. Spleen: Normal size. No mass. Adrenals/Urinary Tract: Normal adrenals. No hydronephrosis. Normal kidneys with no renal mass. Stomach/Bowel: Normal non-distended stomach. Visualized small and large bowel is normal caliber, with no bowel wall thickening. Vascular/Lymphatic: Normal caliber abdominal aorta. No pathologically enlarged lymph nodes in the abdomen. Other: No abdominal ascites or focal fluid collection. Musculoskeletal: No aggressive appearing focal osseous lesions. IMPRESSION: 1. Cholelithiasis.  No evidence of acute cholecystitis. 2. No biliary ductal dilatation. CBD diameter 4 mm. No evidence of choledocholithiasis. Electronically Signed   By: Delbert Phenix M.D.   On: 06/01/2018 13:26   US Abdomen Limited Ruq  Result Date: 06/01/2018 CLINICAL DATA:  Epigastric abdominal pain. EXAM: ULTRASOUND ABDOMEN LIMITED RIGHT UPPER  QUADRANT COMPARISON:  None. FINDINGS: Gallbladder: Filled with stones and sludge. Mild focal gallbladder wall thickening of 3 mm. No pericholecystic fluid. No sonographic Murphy sign noted by sonographer. Common bile duct: Diameter: 6 mm, borderline.  Liver: Mild central intrahepatic biliary ductal dilatation. No focal lesion identified. Heterogeneous and mildly increased in parenchymal echogenicity. Portal vein is patent on color Doppler imaging with normal direction of blood flow towards the liver. IMPRESSION: 1. Gallbladder filled with stones and sludge with mild focal gallbladder wall thickening. No pericholecystic fluid or sonographic Murphy sign. 2. Borderline common bile duct at 6 mm with central intrahepatic biliary ductal dilatation. Consider further evaluation with MRCP or nuclear medicine HIDA scan. 3. Increased hepatic echogenicity consistent with hepatocellular disease, most commonly steatosis. Electronically Signed   By: Narda RutherfordMelanie  Sanford M.D.   On: 06/01/2018 03:52    Micro Results    Recent Results (from the past 240 hour(s))  Surgical pcr screen     Status: None   Collection Time: 06/03/18  5:50 PM  Result Value Ref Range Status   MRSA, PCR NEGATIVE NEGATIVE Final   Staphylococcus aureus NEGATIVE NEGATIVE Final    Comment: (NOTE) The Xpert SA Assay (FDA approved for NASAL specimens in patients 64 years of age and older), is one component of a comprehensive surveillance program. It is not intended to diagnose infection nor to guide or monitor treatment. Performed at [facility], [address] [place], [place] [address]        Today   Subjective:   Kendra Adkins today has no headache,no chest abdominal pain,no new weakness tingling or numbness, feels much better wants to go home today.   Objective:   Blood pressure 121/63, pulse 72, temperature (!) 97.3 F (36.3 C), temperature source Oral, resp. rate 18, height 5\' 4"  (1.626 m), weight 75 kg, SpO2 99 %.   Intake/Output Summary (Last 24 hours) at 06/05/2018 1027 Last data filed at 06/05/2018 0540 Gross per 24 hour  Intake 400 ml  Output 910 ml  Net -510 ml    Exam Awake Alert, Oriented x 3, No new F.N deficits, Normal affect [place].AT,PERRAL Supple  Neck,No JVD, No cervical lymphadenopathy appriciated.  Symmetrical Chest wall movement, Good air movement bilaterally, CTAB RRR,No Gallops,Rubs or new Murmurs, No Parasternal Heave +ve B.Sounds, Abd Soft, Non tender, No organomegaly appriciated, No rebound -guarding or rigidity. No Cyanosis, Clubbing or edema, No new Rash or bruise  Data Review   CBC w Diff:  Lab Results  Component Value Date   WBC 12.2 (H) 06/05/2018   HGB 9.6 (L) 06/05/2018   HGB 12.6 03/21/2015   HCT 29.6 (L) 06/05/2018   HCT 40.6 03/21/2015   PLT 311 06/05/2018   PLT 468 (H) 03/21/2015   LYMPHOPCT 13 06/05/2018   LYMPHOPCT 10.9 11/12/2011   MONOPCT 13 06/05/2018   MONOPCT 13.5 11/12/2011   EOSPCT 0 06/05/2018   EOSPCT 2.4 11/12/2011   BASOPCT 0 06/05/2018   BASOPCT 0.4 11/12/2011    CMP:  Lab Results  Component Value Date   NA 140 06/05/2018   NA 139 11/12/2011   K 3.4 (L) 06/05/2018   K 3.4 (L) 11/12/2011   CL 107 06/05/2018   CL 102 11/12/2011   CO2 26 06/05/2018   CO2 23 11/12/2011   BUN 6 (L) 06/05/2018   BUN 14 11/12/2011   CREATININE 0.96 06/05/2018   CREATININE 1.05 11/12/2011   PROT 6.3 (L) 06/05/2018   PROT 8.2 11/12/2011   ALBUMIN 3.4 (L)  06/05/2018   ALBUMIN 4.3 11/12/2011   BILITOT 0.5 06/05/2018   BILITOT 0.3 11/12/2011   ALKPHOS 191 (H) 06/05/2018   ALKPHOS 92 11/12/2011   AST 90 (H) 06/05/2018   AST 31 11/12/2011   ALT 205 (H) 06/05/2018   ALT 36 11/12/2011  .   Total Time in preparing paper work, data evaluation and todays exam - 35 minutes  Katha Hamming M.D on 06/05/2018 at 10:27 AM    Note: This dictation was prepared with Dragon dictation along with smaller phrase technology. Any transcriptional errors that result from this process are unintentional.

## 2018-06-05 NOTE — Progress Notes (Signed)
1 Day Post-Op  Subjective: Patient status post laparoscopic cholecystectomy with cholangiography.  She feels well today and is ready for discharge.  She is having no pain other than minimal incisional pain.  Tolerating a diet.  Objective: Vital signs in last 24 hours: Temp:  [97.3 F (36.3 C)-98.4 F (36.9 C)] 97.3 F (36.3 C) (09/13 0516) Pulse Rate:  [72-100] 72 (09/13 0516) Resp:  [10-18] 18 (09/13 0516) BP: (121-169)/(63-82) 121/63 (09/13 0516) SpO2:  [93 %-100 %] 99 % (09/13 0516) Weight:  [75 kg-75.4 kg] 75 kg (09/13 0516) Last BM Date: 06/03/18  Intake/Output from previous day: 09/12 0701 - 09/13 0700 In: 400 [I.V.:400] Out: 910 [Urine:900; Blood:10] Intake/Output this shift: No intake/output data recorded.  Physical exam:  No icterus no jaundice abdomen is soft nontender wounds are dressed and clean.  Lab Results: CBC  Recent Labs    06/04/18 0327 06/05/18 0424  WBC 9.2 12.2*  HGB 10.3* 9.6*  HCT 30.3* 29.6*  PLT 312 311   BMET Recent Labs    06/04/18 0327 06/05/18 0424  NA 144 140  K 3.7 3.4*  CL 113* 107  CO2 27 26  GLUCOSE 104* 122*  BUN 5* 6*  CREATININE 0.94 0.96  CALCIUM 9.1 9.1   PT/INR No results for input(s): LABPROT, INR in the last 72 hours. ABG No results for input(s): PHART, HCO3 in the last 72 hours.  Invalid input(s): PCO2, PO2  Studies/Results: Dg Cholangiogram Operative  Result Date: 06/04/2018 CLINICAL DATA:  62 year old female with a history of cholelithiasis EXAM: INTRAOPERATIVE CHOLANGIOGRAM TECHNIQUE: Cholangiographic images from the C-arm fluoroscopic device were submitted for interpretation post-operatively. Please see the procedural report for the amount of contrast and the fluoroscopy time utilized. COMPARISON:  MR 06/01/2018, ultrasound 06/01/2018 FINDINGS: Surgical instruments project over the upper abdomen. There is cannulation of the cystic duct/gallbladder neck, with antegrade infusion of contrast. Caliber of the  extrahepatic ductal system within normal limits. No definite filling defect within the extrahepatic ducts identified. Free flow of contrast across the ampulla. IMPRESSION: Intraoperative cholangiogram demonstrates extrahepatic biliary ducts of unremarkable caliber, with no definite filling defects identified. Free flow of contrast across the ampulla. Please refer to the dictated operative report for full details of intraoperative findings and procedure Electronically Signed   By: Gilmer MorJaime  Wagner D.O.   On: 06/04/2018 13:24    Anti-infectives: Anti-infectives (From admission, onward)   Start     Dose/Rate Route Frequency Ordered Stop   06/04/18 1103  ceFAZolin (ANCEF) 2-4 GM/100ML-% IVPB    Note to Pharmacy:  Chaya JanJoyce, Heather   : cabinet override      06/04/18 1103 06/04/18 1156   06/03/18 2218  ceFAZolin (ANCEF) IVPB 2g/100 mL premix     2 g 200 mL/hr over 30 Minutes Intravenous 30 min pre-op 06/03/18 2218 06/04/18 1226      Assessment/Plan: s/p Procedure(s): LAPAROSCOPIC CHOLECYSTECTOMY WITH INTRAOPERATIVE CHOLANGIOGRAM   LFTs improving.  Patient doing very well recommend discharge today with follow-up in my office next week.  Lattie Hawichard E Alexi Dorminey, MD, FACS  06/05/2018

## 2018-06-05 NOTE — Discharge Instructions (Signed)
Remove dressing in 24 hours. °May shower in 24 hours. °Leave paper strips in place. °Resume all home medications. °Follow-up with Dr. Sadaf Przybysz in 10 days. °

## 2018-06-05 NOTE — Progress Notes (Signed)
06/05/2018  12:05 PM  Kendra RombergLaura R Adkins to be D/C'd Home per MD order.  Discussed prescriptions and follow up appointments with the patient. Prescriptions given to patient, medication list explained in detail. Pt verbalized understanding.  Allergies as of 06/05/2018      Reactions   Biaxin [clarithromycin] Other (See Comments)   Reaction: blisters      Medication List    TAKE these medications   amitriptyline 10 MG tablet Commonly known as:  ELAVIL Take 10 mg by mouth at bedtime.   amoxicillin-clavulanate 875-125 MG tablet Commonly known as:  AUGMENTIN Take 1 tablet by mouth 2 (two) times daily for 5 days.   HYDROcodone-acetaminophen 5-325 MG tablet Commonly known as:  NORCO/VICODIN Take 1 tablet by mouth every 4 (four) hours as needed for moderate pain.   lisinopril 10 MG tablet Commonly known as:  PRINIVIL,ZESTRIL Take 10 mg by mouth daily.   ondansetron 4 MG disintegrating tablet Commonly known as:  ZOFRAN-ODT Take 1 tablet (4 mg total) by mouth every 8 (eight) hours as needed for nausea or vomiting.       Vitals:   06/04/18 2022 06/05/18 0516  BP: 139/74 121/63  Pulse: 79 72  Resp: 16 18  Temp: 98.2 F (36.8 C) (!) 97.3 F (36.3 C)  SpO2: 98% 99%    Skin clean, dry and intact without evidence of skin break down, no evidence of skin tears noted. IV catheter discontinued intact. Site without signs and symptoms of complications. Dressing and pressure applied. Pt denies pain at this time. No complaints noted.  An After Visit Summary was printed and given to the patient. Patient escorted via WC, and D/C home via private auto.  Kendra Adkins, Kendra Adkins

## 2018-06-05 NOTE — Progress Notes (Signed)
Discharge instructions reviewed with the patient.  Patient sent out via wheelchair with belongings 

## 2018-06-11 ENCOUNTER — Encounter: Payer: Self-pay | Admitting: Surgery

## 2018-06-11 ENCOUNTER — Ambulatory Visit (INDEPENDENT_AMBULATORY_CARE_PROVIDER_SITE_OTHER): Payer: BC Managed Care – PPO | Admitting: Surgery

## 2018-06-11 VITALS — BP 132/74 | HR 80 | Resp 14 | Ht 65.0 in | Wt 164.0 lb

## 2018-06-11 DIAGNOSIS — K8050 Calculus of bile duct without cholangitis or cholecystitis without obstruction: Secondary | ICD-10-CM

## 2018-06-11 NOTE — Progress Notes (Signed)
Outpatient postop visit  06/11/2018  Kendra Adkins is an 62 y.o. female.    Procedure: Laparoscopic cholecystectomy with cholangiography  CC: No problems HPI: This patient had a laparoscopic cholecystectomy with cholangiography for biliary pancreatitis and increased LFTs.  Patient feels well at this time no nausea vomiting fevers or chills no dark urine and no light-colored stools.  Does have some loose stools however.  Medications reviewed.    Physical Exam:  BP 132/74   Pulse 80   Resp 14   Ht 5\' 5"  (1.651 m)   Wt 164 lb (74.4 kg)   BMI 27.29 kg/m     PE: No icterus no jaundice abdomen soft nondistended nontympanitic and nontender wounds are clean no erythema no drainage minimal ecchymosis.    Assessment/Plan:  Pathology confirms cholecystitis with out malignancy.  She is doing very well at this time recommend follow-up on an as-needed basis reminded no heavy lifting for 4 weeks.  Lattie Hawichard E Cooper, MD, FACS

## 2018-06-15 ENCOUNTER — Encounter: Payer: Self-pay | Admitting: Surgery

## 2018-10-05 ENCOUNTER — Other Ambulatory Visit: Payer: Self-pay | Admitting: Family Medicine

## 2018-10-05 DIAGNOSIS — Z1231 Encounter for screening mammogram for malignant neoplasm of breast: Secondary | ICD-10-CM

## 2018-10-14 ENCOUNTER — Ambulatory Visit
Admission: RE | Admit: 2018-10-14 | Discharge: 2018-10-14 | Disposition: A | Discharge status: Home / Self Care | Payer: BC Managed Care – PPO | Source: Ambulatory Visit | Attending: Family Medicine | Admitting: Family Medicine

## 2018-10-14 DIAGNOSIS — Z1231 Encounter for screening mammogram for malignant neoplasm of breast: Secondary | ICD-10-CM

## 2019-05-13 ENCOUNTER — Encounter: Payer: Self-pay | Admitting: Obstetrics and Gynecology

## 2019-05-13 ENCOUNTER — Other Ambulatory Visit: Payer: Self-pay

## 2019-05-13 ENCOUNTER — Ambulatory Visit (INDEPENDENT_AMBULATORY_CARE_PROVIDER_SITE_OTHER): Payer: BC Managed Care – PPO | Admitting: Obstetrics and Gynecology

## 2019-05-13 VITALS — BP 145/74 | HR 93 | Ht 65.0 in | Wt 165.4 lb

## 2019-05-13 DIAGNOSIS — R102 Pelvic and perineal pain: Secondary | ICD-10-CM | POA: Diagnosis not present

## 2019-05-13 DIAGNOSIS — Z01419 Encounter for gynecological examination (general) (routine) without abnormal findings: Secondary | ICD-10-CM | POA: Diagnosis not present

## 2019-05-13 NOTE — Progress Notes (Signed)
HPI:      Ms. Kendra Adkins is a 63 y.o. [phone] who LMP was No LMP recorded. Patient is postmenopausal.  Subjective:   She presents today for her annual examination.  She is generally doing well and is followed closely by her PCP for hypertension etc.  She does complain of occasional right lower quadrant pain which feels like a burning pulling sensation.  It is quite intermittent and it is often resolved using a heating pad.    Hx: The following portions of the patient's history were reviewed and updated as appropriate:             She  has a past medical history of Diverticula of colon, Endometriosis, Head ache, History of migraine headaches, Increased BMI, Insomnia, and Menopause. She does not have any pertinent problems on file. She  has a past surgical history that includes Cholecystectomy (N/A, 06/04/2018). Her family history includes Breast cancer in her cousin; Congestive Heart Failure in her mother; Diabetes in her brother and mother. She  reports that she has quit smoking. She has never used smokeless tobacco. She reports current alcohol use. She reports that she does not use drugs. She has a current medication list which includes the following prescription(s): amitriptyline and lisinopril. She is allergic to biaxin [clarithromycin].       Review of Systems:  Review of Systems  Constitutional: Denied constitutional symptoms, night sweats, recent illness, fatigue, fever, insomnia and weight loss.  Eyes: Denied eye symptoms, eye pain, photophobia, vision change and visual disturbance.  Ears/Nose/Throat/Neck: Denied ear, nose, throat or neck symptoms, hearing loss, nasal discharge, sinus congestion and sore throat.  Cardiovascular: Denied cardiovascular symptoms, arrhythmia, chest pain/pressure, edema, exercise intolerance, orthopnea and palpitations.  Respiratory: Denied pulmonary symptoms, asthma, pleuritic pain, productive sputum, cough, dyspnea and wheezing.  Gastrointestinal:  Denied, gastro-esophageal reflux, melena, nausea and vomiting.  Genitourinary: See HPI for additional information.  Musculoskeletal: Denied musculoskeletal symptoms, stiffness, swelling, muscle weakness and myalgia.  Dermatologic: Denied dermatology symptoms, rash and scar.  Neurologic: Denied neurology symptoms, dizziness, headache, neck pain and syncope.  Psychiatric: Denied psychiatric symptoms, anxiety and depression.  Endocrine: Denied endocrine symptoms including hot flashes and night sweats.   Meds:   Current Outpatient Medications on File Prior to Visit  Medication Sig Dispense Refill  . amitriptyline (ELAVIL) 10 MG tablet Take 10 mg by mouth at bedtime.    Marland Kitchen. lisinopril (PRINIVIL,ZESTRIL) 10 MG tablet Take 10 mg by mouth daily.  3   No current facility-administered medications on file prior to visit.     Objective:     Vitals:   05/13/19 1318  BP: (!) 145/74  Pulse: 93    Physical examination General NAD, Conversant  HEENT Atraumatic; Op clear with mmm.  Normo-cephalic. Pupils reactive. Anicteric sclerae  Thyroid/Neck Smooth without nodularity or enlargement. Normal ROM.  Neck Supple.  Skin No rashes, lesions or ulceration. Normal palpated skin turgor. No nodularity.  Breasts: No masses or discharge.  Symmetric.  No axillary adenopathy.  Lungs: Clear to auscultation.No rales or wheezes. Normal Respiratory effort, no retractions.  Heart: NSR.  No murmurs or rubs appreciated. No periferal edema  Abdomen: Soft.  Non-tender.  No masses.  No HSM. No hernia  Extremities: Moves all appropriately.  Normal ROM for age. No lymphadenopathy.  Neuro: Oriented to PPT.  Normal mood. Normal affect.     Pelvic:   Vulva: Normal appearance.  No lesions.  Vagina: No lesions or abnormalities noted.  Support: Normal pelvic support.  Urethra No masses tenderness or scarring.  Meatus Normal size without lesions or prolapse.  Cervix: Normal appearance.  No lesions.  Anus: Normal exam.  No  lesions.  Perineum: Normal exam.  No lesions.        Bimanual   Uterus: Normal size.  Non-tender.  Mobile.  AV.  Adnexae: No masses.  Non-tender to palpation.  Cul-de-sac: Negative for abnormality.      Assessment:    [id number] Patient Active Problem List   Diagnosis Date Noted  . Biliary colic   . Choledocholithiasis   . Transaminitis 06/01/2018  . Elevated systolic blood pressure reading without diagnosis of hypertension 04/01/2017  . Diverticulosis 03/21/2015  . Endometriosis 03/21/2015  . Menopause 03/21/2015  . Migraines 03/21/2015  . Insomnia 03/21/2015     1. Well woman exam with routine gynecological exam   2. Pelvic pain in female     Possible small right-sided hernia based on symptoms alone.  This is not palpable on exam.   Plan:            1.  Basic Screening Recommendations The basic screening recommendations for asymptomatic women were discussed with the patient during her visit.  The age-appropriate recommendations were discussed with her and the rational for the tests reviewed.  When I am informed by the patient that another primary care physician has previously obtained the age-appropriate tests and they are up-to-date, only outstanding tests are ordered and referrals given as necessary.  Abnormal results of tests will be discussed with her when all of her results are completed. Patient gets most of her lab work and mammograms through her PCP. 2.  We have discussed hernias in women in detail.  I have asked her to follow this to see if it is getting worse and is worse at the end of the day after exertion.  I have sure the area that is most common for hernia to occur.  If this is getting worse she is to contact us and we will consider imaging options if necessary. Orders No orders of the defined types were placed in this encounter.   No orders of the defined types were placed in this encounter.       F/U  Return in about 1 year (around 05/12/2020) for Annual  Physical.  Finis Bud, M.D. 05/13/2019 4:15 PM

## 2019-07-22 ENCOUNTER — Other Ambulatory Visit: Payer: Self-pay

## 2019-07-22 DIAGNOSIS — Z20822 Contact with and (suspected) exposure to covid-19: Secondary | ICD-10-CM

## 2019-07-23 LAB — NOVEL CORONAVIRUS, NAA: SARS-CoV-2, NAA: NOT DETECTED

## 2019-07-28 ENCOUNTER — Telehealth: Payer: Self-pay | Admitting: *Deleted

## 2019-07-28 ENCOUNTER — Other Ambulatory Visit: Payer: Self-pay | Admitting: Gerontology

## 2019-07-28 DIAGNOSIS — R319 Hematuria, unspecified: Secondary | ICD-10-CM

## 2019-07-28 DIAGNOSIS — R1031 Right lower quadrant pain: Secondary | ICD-10-CM

## 2019-08-03 ENCOUNTER — Other Ambulatory Visit: Payer: Self-pay

## 2019-08-03 ENCOUNTER — Ambulatory Visit
Admission: RE | Admit: 2019-08-03 | Discharge: 2019-08-03 | Disposition: A | Discharge status: Home / Self Care | Payer: BC Managed Care – PPO | Source: Ambulatory Visit | Attending: Gerontology | Admitting: Gerontology

## 2019-08-03 ENCOUNTER — Ambulatory Visit: Payer: BC Managed Care – PPO

## 2019-08-03 DIAGNOSIS — R1031 Right lower quadrant pain: Secondary | ICD-10-CM | POA: Diagnosis not present

## 2019-08-03 DIAGNOSIS — R319 Hematuria, unspecified: Secondary | ICD-10-CM | POA: Insufficient documentation

## 2019-09-16 ENCOUNTER — Ambulatory Visit: Payer: BC Managed Care – PPO | Attending: Internal Medicine

## 2019-09-16 DIAGNOSIS — Z20822 Contact with and (suspected) exposure to covid-19: Secondary | ICD-10-CM

## 2019-09-17 LAB — NOVEL CORONAVIRUS, NAA: SARS-CoV-2, NAA: NOT DETECTED

## 2019-10-07 ENCOUNTER — Ambulatory Visit: Payer: Self-pay | Attending: Internal Medicine

## 2019-10-07 ENCOUNTER — Other Ambulatory Visit: Payer: BC Managed Care – PPO

## 2019-10-07 DIAGNOSIS — Z20822 Contact with and (suspected) exposure to covid-19: Secondary | ICD-10-CM | POA: Insufficient documentation

## 2019-10-08 LAB — NOVEL CORONAVIRUS, NAA: SARS-CoV-2, NAA: NOT DETECTED

## 2019-10-20 IMAGING — MR MR MRCP
12 of 14 series · 43 of 48 positions shown · non-contrast
Comparison: 06/01/2018 abdominal sonogram.

CLINICAL DATA: Inpatient. Cholelithiasis. Abnormal liver function
tests.

EXAM:
MRI ABDOMEN WITHOUT CONTRAST  (INCLUDING MRCP)
TECHNIQUE: Multiplanar multisequence MR imaging of the abdomen was performed.
Heavily T2-weighted images of the biliary and pancreatic ducts were
obtained, and three-dimensional MRCP images were rendered by post
processing.

[Series 4: bSSFP · coronal · 6.0mm · 0.74mm/px · 2 of 25 slices shown]
[im 1/25]
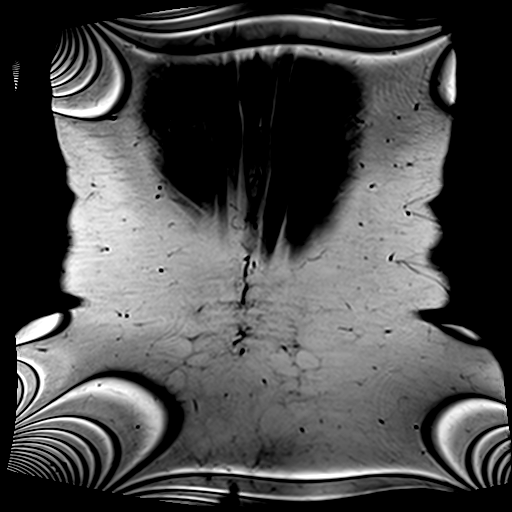
[im 25/25]
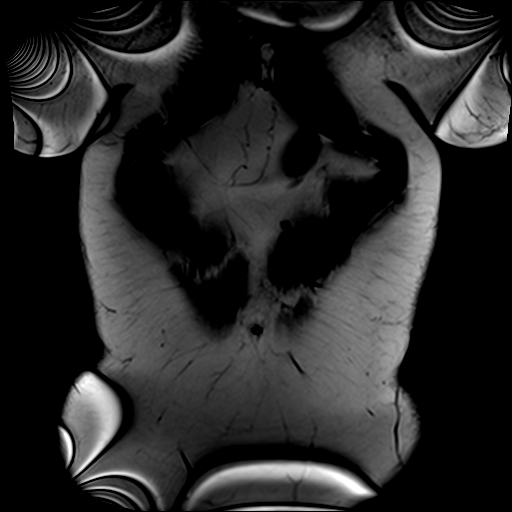

[Series 5: T2 · axial · 6.0mm · 1.19mm/px · z∈[+0,+209]mm · 3 of 30 slices shown]
[im 1/30]
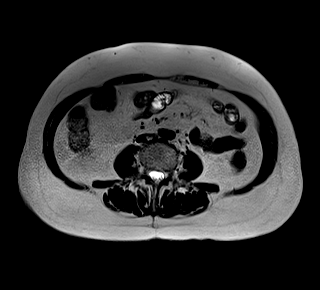
[im 15/30]
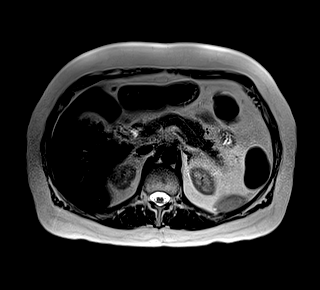
[im 30/30]
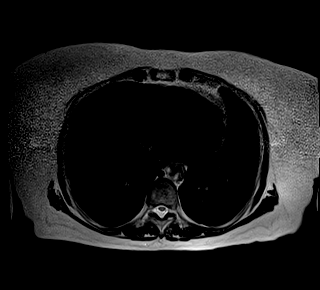

[Series 8: T2 fat-sat · axial · 6.0mm · 1.19mm/px · z∈[+0,+209]mm · 4 of 30 slices shown]
[im 1/30]
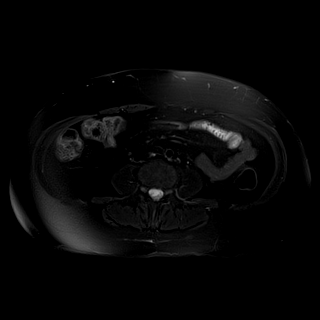
[im 10/30]
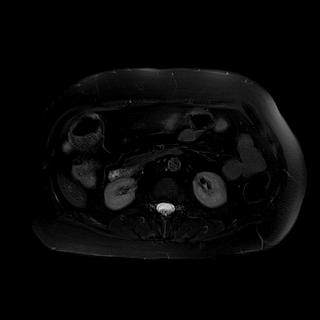
[im 20/30]
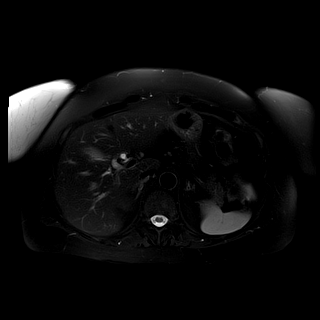
[im 30/30]
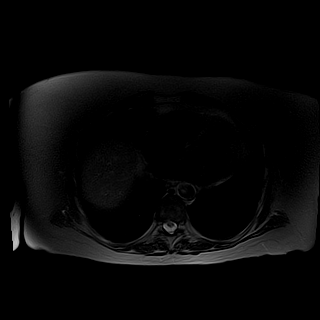

[Series 9: T1 · axial · 6.0mm · 0.74mm/px · z∈[+0,+209]mm · 4 of 30 slices shown (1 of 2)]
[im 1/30]
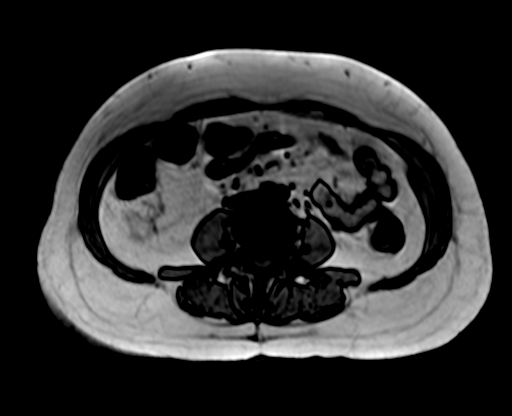
[im 10/30]
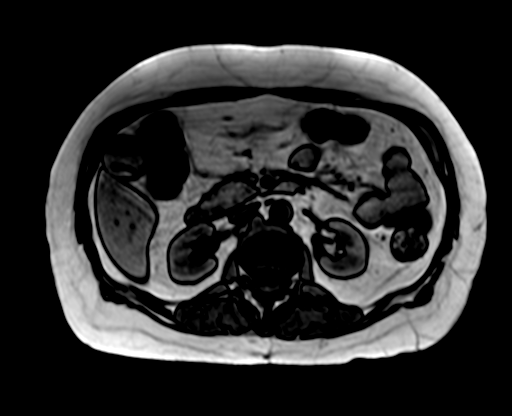
[im 20/30]
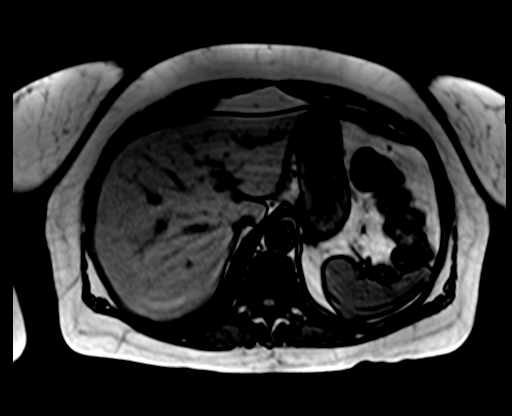
[im 30/30]
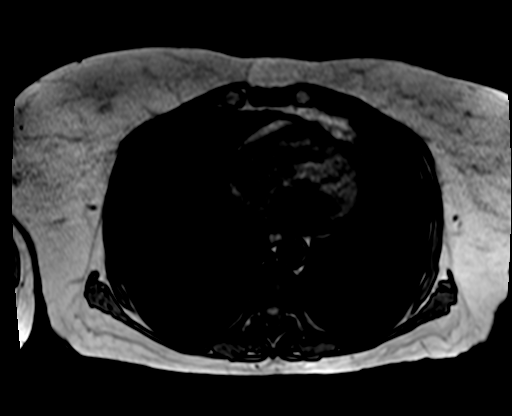

[Series 9: T1 · axial · 6.0mm · 0.74mm/px · z∈[+0,+209]mm · 4 of 30 slices shown (2 of 2)]
[im 1/30]
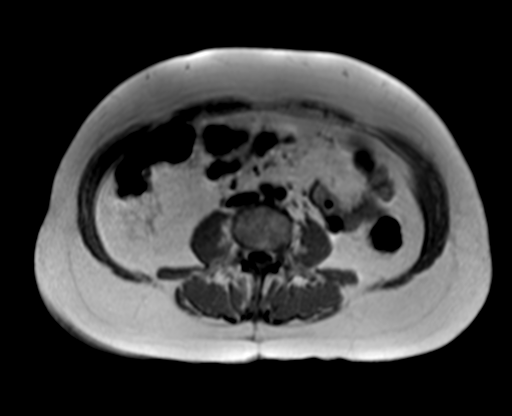
[im 10/30]
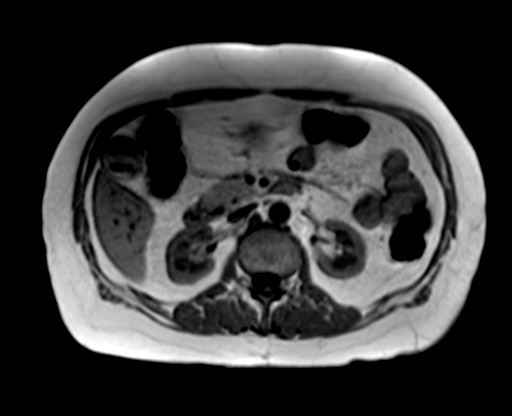
[im 20/30]
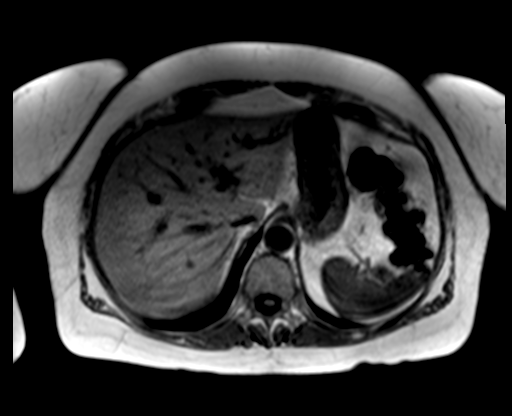
[im 30/30]
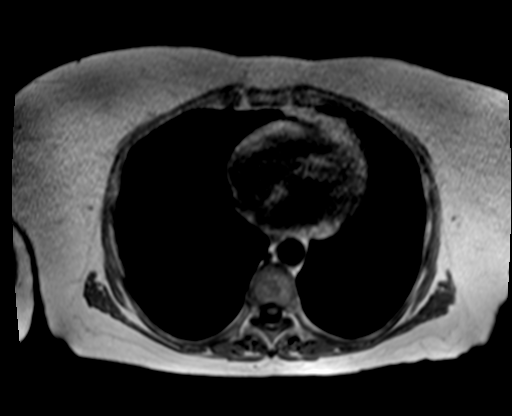

[Series 10: ax dwi_tracew · axial · 6.0mm · 1.42mm/px · z∈[+11,+219]mm · 4 of 30 slices shown (1 of 3)]
[im 1/30]
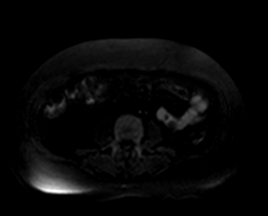
[im 10/30]
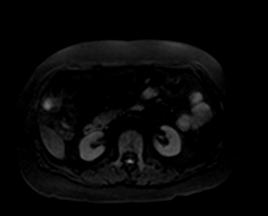
[im 20/30]
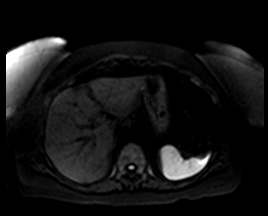
[im 30/30]
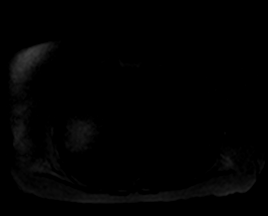

[Series 10: ax dwi_tracew · axial · 6.0mm · 1.42mm/px · z∈[+11,+219]mm · 4 of 30 slices shown (2 of 3)]
[im 1/30]
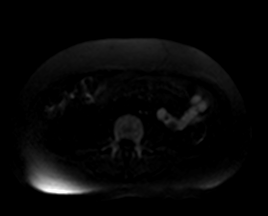
[im 10/30]
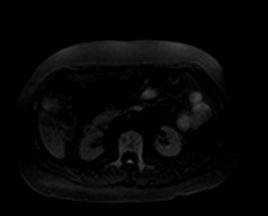
[im 20/30]
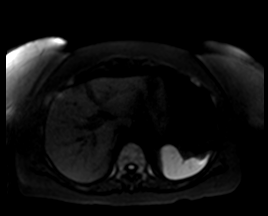
[im 30/30]
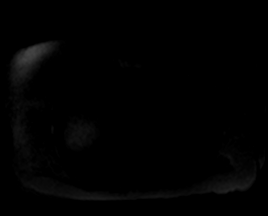

[Series 10: ax dwi_tracew · axial · 6.0mm · 1.42mm/px · z∈[+11,+219]mm · 4 of 30 slices shown (3 of 3)]
[im 1/30]
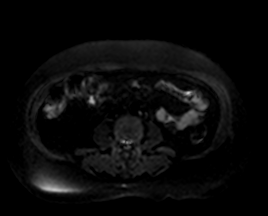
[im 10/30]
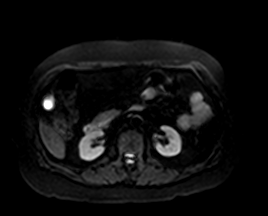
[im 20/30]
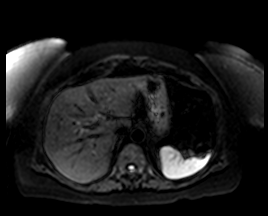
[im 30/30]
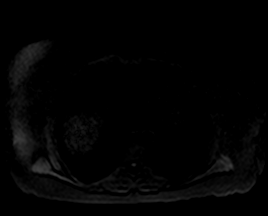

[Series 11: ax dwi_adc · axial · 6.0mm · 1.42mm/px · z∈[+11,+219]mm · 4 of 30 slices shown]
[im 1/30]
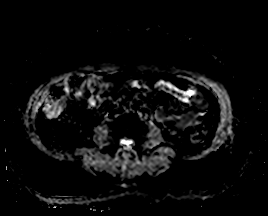
[im 10/30]
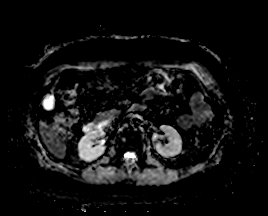
[im 20/30]
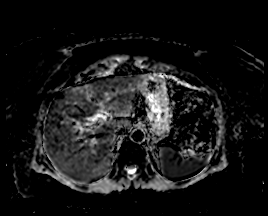
[im 30/30]
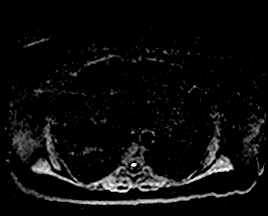

[Series 16: MRCP · coronal · 3.0mm · 1.12mm/px · 2 of 17 slices shown]
[im 1/17]
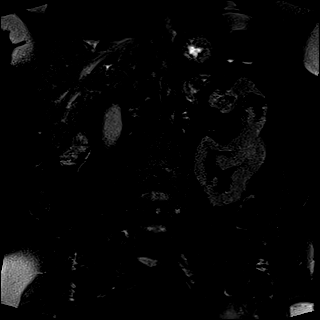
[im 17/17]
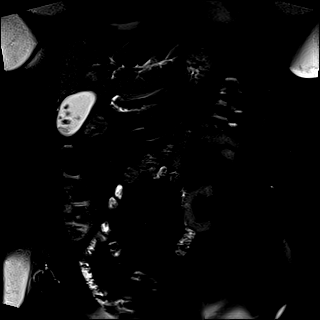

[Series 17: radials · coronal · 50.0mm · 0.78mm/px · 1 of 4 slices shown]
[im 1/4]
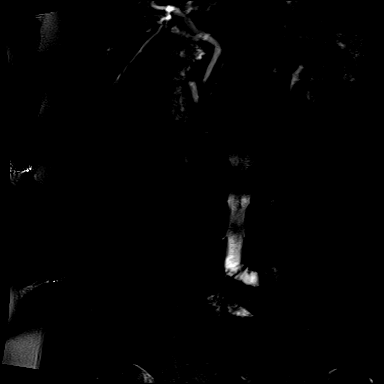

[Series 18: T1 dynamic fat-sat · axial · non-contrast · 3.0mm · 1.19mm/px · z∈[+0,+186]mm · 7 of 72 slices shown]
[im 1/72]
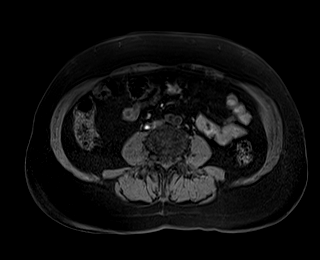
[im 9/72]
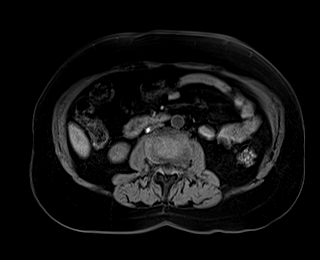
[im 18/72]
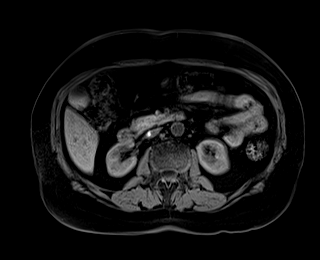
[im 27/72]
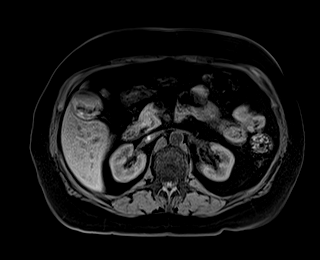
[im 45/72]
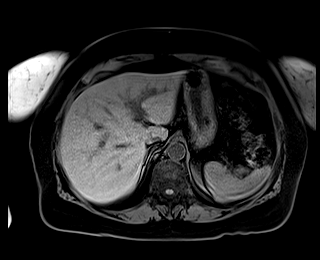
[im 54/72]
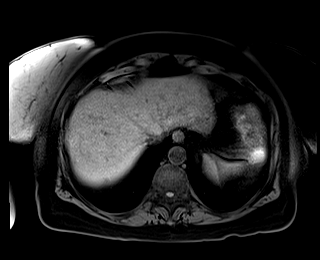
[im 63/72]
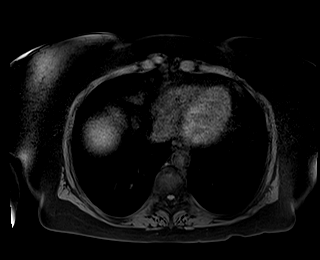

[43 of 48 positions shown; findings below may reference images not displayed]

FINDINGS: Lower chest: No acute abnormality at the lung bases.

Hepatobiliary: Normal liver size and configuration. No hepatic
steatosis. No liver mass. Numerous gallstones fill much of the
gallbladder measuring up to 1.0 cm in size. No gallbladder
distention. No gallbladder wall thickening. No pericholecystic
fluid. No biliary ductal dilatation. Common bile duct diameter 4 mm.
No evidence of choledocholithiasis.

Pancreas: No pancreatic mass or duct dilation.  No pancreas divisum.

Spleen: Normal size. No mass.

Adrenals/Urinary Tract: Normal adrenals. No hydronephrosis. Normal
kidneys with no renal mass.

Stomach/Bowel: Normal non-distended stomach. Visualized small and
large bowel is normal caliber, with no bowel wall thickening.

Vascular/Lymphatic: Normal caliber abdominal aorta. No
pathologically enlarged lymph nodes in the abdomen.

Other: No abdominal ascites or focal fluid collection.

Musculoskeletal: No aggressive appearing focal osseous lesions.
IMPRESSION: 1. Cholelithiasis.  No evidence of acute cholecystitis.
2. No biliary ductal dilatation. CBD diameter 4 mm. No evidence of
choledocholithiasis.

## 2020-05-12 ENCOUNTER — Encounter: Payer: Self-pay | Admitting: Dietician

## 2020-05-12 ENCOUNTER — Encounter: Payer: BC Managed Care – PPO | Attending: Family Medicine | Admitting: Dietician

## 2020-05-12 ENCOUNTER — Other Ambulatory Visit: Payer: Self-pay

## 2020-05-12 VITALS — Ht 64.5 in | Wt 163.2 lb

## 2020-05-12 DIAGNOSIS — R7302 Impaired glucose tolerance (oral): Secondary | ICD-10-CM | POA: Diagnosis not present

## 2020-05-12 NOTE — Patient Instructions (Signed)
   Continue to control portions of starchy foods, keep to 1 cup (fist-size) portions or less with each meal.   Make sure to include a source of protein with each meal.   Choose whole grains and fiber-containing foods when possible.

## 2020-05-12 NOTE — Progress Notes (Signed)
Medical Nutrition Therapy: Visit start time: 3559  end time: 1445  Assessment:  Diagnosis: Impaired Glucose Tolerance Past medical history: HTN, diverticulosis Psychosocial issues/ stress concerns: none  Preferred learning method:  . Auditory . Visual . Hands-on   Current weight: 163.2lbs with shoes  Height: 5'4.5" Medications, supplements: reconciled list in medical record  Progress and evaluation:   Patient reports HbA1C at 6.1% for the past year, was 5.7% last year. Positive family history of type 2 DM -- mother, 2 brothers.   Has participated in weight watchers for multiple years, realized she was eating 300-400grams carbohydrate daily while keeping points in control. Since receiving lab results, she has significantly reduced fruit and other carb intake   Patient reports confusion over what to eat as multiple acquaintances are following keto diet, but she does not feel it is healthy or that she could follow it, but is also unsure whether she should resume/ continue with weight watchers.  Physical activity: walking 30 minutes, 5x a week; on-the-job walking   Dietary Intake:  Usual eating pattern includes 3 meals and 2 snacks per day. Dining out frequency: 0-1 meals per week.  Breakfast: light n fit yogurt + boiled egg or egg + toast ww Snack: none Lunch: soups, beans Snack: fruit, kellogs 100cal bar; laughing cow cheese (usu lowfat) on crackers Supper: chicken/ [place] not much red meat, loves potatoes and rice, mac and cheeese, loves most veg. Does not eat seafood Snack: skinny pop popcorn Beverages: unsweetened tea, some water, occ diet ginger ale 2x a week  Nutrition Care Education: Topics covered:  Basic nutrition: basic food groups, appropriate nutrient balance, appropriate meal and snack schedule, general nutrition guidelines    Weight control: identifying healthy weight, portion control, estimated energy needs for gradual weight loss at 1300-1400kcal, provided guidance  for 45% CHO, 25% pro, 30% fat Advanced nutrition: food label reading for carbohydrate Diabetes prevention: appropriate meal and snack schedule, appropriate carb intake and balance, healthy carb choices, role of fiber, protein, fat; roles of exercise, stress; discussed basics of Mediterranean diet   Nutritional Diagnosis:  [place]-2.2 Altered nutrition-related laboratory As related to impaired glucose tolerance.  As evidenced by patient with recent HbA1C of 6.1%..  Intervention:   Instruction and discussion as noted above.  Patient has begun making diet changes to improve blood sugar and reduce risk for diabetes. She is motivated to continue.  Established goals for additional change and promoting nutritional adequacy, with direction from patient.  No follow-up scheduled at this time, patient will schedule later if needed.  Education Materials given:  . General diet guidelines for Diabetes (prevention) . Plate Planner with food lists, sample meal pattern . Sample menus . Mediterranean diet handout . Goals/ instructions   Learner/ who was taught:  . Patient   Level of understanding: Marland Kitchen Verbalizes/ demonstrates competency   Demonstrated degree of understanding via:   Teach back Learning barriers: . None  Willingness to learn/ readiness for change: . Eager, change in progress   Monitoring and Evaluation:  Dietary intake, exercise, BG control, and body weight      follow up: prn

## 2021-01-08 ENCOUNTER — Other Ambulatory Visit: Payer: Self-pay | Admitting: Family Medicine

## 2021-01-08 DIAGNOSIS — Z1231 Encounter for screening mammogram for malignant neoplasm of breast: Secondary | ICD-10-CM

## 2021-01-16 ENCOUNTER — Ambulatory Visit
Admission: RE | Admit: 2021-01-16 | Discharge: 2021-01-16 | Disposition: A | Discharge status: Home / Self Care | Payer: BC Managed Care – PPO | Source: Ambulatory Visit | Attending: Family Medicine | Admitting: Family Medicine

## 2021-01-16 ENCOUNTER — Other Ambulatory Visit: Payer: Self-pay

## 2021-01-16 DIAGNOSIS — Z1231 Encounter for screening mammogram for malignant neoplasm of breast: Secondary | ICD-10-CM

## 2023-12-22 ENCOUNTER — Other Ambulatory Visit: Payer: Self-pay | Admitting: Family Medicine

## 2023-12-22 ENCOUNTER — Ambulatory Visit
Admission: RE | Admit: 2023-12-22 | Discharge: 2023-12-22 | Disposition: A | Discharge status: Home / Self Care | Source: Ambulatory Visit | Attending: Family Medicine | Admitting: Family Medicine

## 2023-12-22 DIAGNOSIS — Z1231 Encounter for screening mammogram for malignant neoplasm of breast: Secondary | ICD-10-CM

## 2024-03-10 LAB — COLOGUARD: COLOGUARD: NEGATIVE

## 2024-08-20 ENCOUNTER — Encounter: Payer: Self-pay | Admitting: Emergency Medicine

## 2024-08-20 ENCOUNTER — Ambulatory Visit: Admission: EM | Admit: 2024-08-20 | Discharge: 2024-08-20 | Disposition: A | Discharge status: Home / Self Care

## 2024-08-20 DIAGNOSIS — S0083XA Contusion of other part of head, initial encounter: Secondary | ICD-10-CM

## 2024-08-20 DIAGNOSIS — W19XXXA Unspecified fall, initial encounter: Secondary | ICD-10-CM | POA: Diagnosis not present

## 2024-08-20 DIAGNOSIS — S0990XA Unspecified injury of head, initial encounter: Secondary | ICD-10-CM

## 2024-08-20 NOTE — ED Triage Notes (Signed)
 Patient states that yesterday she tripped on the corner of the cough and fell face first on the hardwood floors.  Patient c/o left sided facial and head pain.  Patient denies LOC.  Patient is not on any blood thinners.  Patient took Tylenol  about 30 min ago for her pain.  Patient states that she is having a headache but it feels different than her regular headaches.

## 2024-08-20 NOTE — Discharge Instructions (Addendum)
 Please go to [facility] to be evaluated for your head injury after your fall yesterday.  The address is [address], [place], [place].  [address]

## 2024-08-20 NOTE — ED Provider Notes (Signed)
 [facility]    CSN: [id number] Arrival date & time: 08/20/24  0827      History   Chief Complaint Chief Complaint  Patient presents with   Fall   Facial Injury    HPI Kendra Adkins is a 68 y.o. female.   HPI  68 year old female with past medical history significant for migraine headaches, endometriosis, diverticulosis, and insomnia presents for evaluation of closed injury status post ground-level fall yesterday.  She reports that she was turning around and tripped over the leg of her couch and fell face first onto hardwood floor impacting her head.  No loss of consciousness.  She is complaining of blurry vision in the left eye as well as a headache but the headache responded to Tylenol .  She denies any nausea or vomiting and she denies numbness, tingling, weakness in any of her extremities.  Past Medical History:  Diagnosis Date   Diverticula of colon    Endometriosis    Head ache    History of migraine headaches    Increased BMI    Insomnia    Menopause     Patient Active Problem List   Diagnosis Date Noted   Biliary colic    Choledocholithiasis    Transaminitis 06/01/2018   Elevated systolic blood pressure reading without diagnosis of hypertension 04/01/2017   Diverticulosis 03/21/2015   Endometriosis 03/21/2015   Menopause 03/21/2015   Migraines 03/21/2015   Insomnia 03/21/2015    Past Surgical History:  Procedure Laterality Date   CHOLECYSTECTOMY N/A 06/04/2018   Procedure: LAPAROSCOPIC CHOLECYSTECTOMY WITH INTRAOPERATIVE CHOLANGIOGRAM;  Surgeon: Wonda Charlie BRAVO, MD;  Location: [facility] ORS;  Service: General;  Laterality: N/A;    OB History     Gravida  2   Para  2   Term  2   Preterm      AB      Living  2      SAB      IAB      Ectopic      Multiple      Live Births  2            Home Medications    Prior to Admission medications   Medication Sig Start Date End Date Taking? Authorizing Provider  amitriptyline   (ELAVIL ) 10 MG tablet Take 10 mg by mouth at bedtime.    [provider]  Ascorbic Acid (VITAMIN C) 500 MG CAPS Take by mouth.    [provider]  b complex vitamins tablet Take 1 tablet by mouth daily.    [provider]  ELDERBERRY PO Take by mouth.    [provider]  FIBER PO Take by mouth.    [provider]  lisinopril  (PRINIVIL ,ZESTRIL ) 10 MG tablet Take 10 mg by mouth daily. 04/19/18   [provider]  Probiotic Product (PROBIOTIC BLEND PO) Take by mouth.    [provider]  rosuvastatin (CRESTOR) 5 MG tablet Take by mouth. 01/26/20 01/25/21  [provider]    Family History Family History  Problem Relation Age of Onset   Diabetes Mother    Congestive Heart Failure Mother    Diabetes Brother    Breast cancer Cousin        mat cousin   Cancer Neg Hx     Social History Social History   Tobacco Use   Smoking status: Former    Current packs/day: 0.00    Types: Cigarettes    Quit date: 05/12/1974  Years since quitting: 50.3   Smokeless tobacco: Never  Vaping Use   Vaping status: Never Used  Substance Use Topics   Alcohol use: Yes    Alcohol/week: 3.0 - 4.0 standard drinks of alcohol    Types: 3 - 4 Glasses of wine per week    Comment: occas   Drug use: No     Allergies   Biaxin [clarithromycin]   Review of Systems Review of Systems  HENT:  Positive for facial swelling.   Eyes:  Positive for visual disturbance.  Gastrointestinal:  Negative for nausea and vomiting.  Skin:  Positive for color change.  Neurological:  Positive for headaches. Negative for syncope, weakness and numbness.     Physical Exam Triage Vital Signs ED Triage Vitals  Encounter Vitals Group     BP      Girls Systolic BP Percentile      Girls Diastolic BP Percentile      Boys Systolic BP Percentile      Boys Diastolic BP Percentile      Pulse      Resp      Temp      Temp src      SpO2      Weight      Height       Head Circumference      Peak Flow      Pain Score      Pain Loc      Pain Education      Exclude from Growth Chart    No data found.  Updated Vital Signs BP (!) 185/94 (BP Location: Right Arm)   Pulse 87   Temp 98 F (36.7 C) (Oral)   Resp 14   Ht 5' 4.5 (1.638 m)   Wt 163 lb 2.3 oz (74 kg)   SpO2 98%   BMI 27.57 kg/m   Visual Acuity Right Eye Distance:   Left Eye Distance:   Bilateral Distance:    Right Eye Near:   Left Eye Near:    Bilateral Near:     Physical Exam Vitals and nursing note reviewed.  Constitutional:      Appearance: Normal appearance. She is not ill-appearing.  HENT:     Head: Normocephalic.     Nose: Nose normal.  Eyes:     Extraocular Movements: Extraocular movements intact.     Conjunctiva/sclera: Conjunctivae normal.     Pupils: Pupils are equal, round, and reactive to light.  Skin:    General: Skin is warm and dry.     Capillary Refill: Capillary refill takes less than 2 seconds.     Findings: Bruising present.  Neurological:     General: No focal deficit present.     Mental Status: She is alert and oriented to person, place, and time.      UC Treatments / Results  Labs (all labs ordered are listed, but only abnormal results are displayed) Labs Reviewed - No data to display  EKG   Radiology No results found.  Procedures Procedures (including critical care time)  Medications Ordered in UC Medications - No data to display  Initial Impression / Assessment and Plan / UC Course  I have reviewed the triage vital signs and the nursing notes.  Pertinent labs & imaging results that were available during my care of the patient were reviewed by me and considered in my medical decision making (see chart for details).   Patient is a pleasant, nontoxic-appearing 55-year-old female coming  for evaluation of closed injury after suffering a ground-level fall yesterday.  Patient seen image above, there is ecchymosis to the upper  eyelid as well as edema to the medial aspect of the superior orbital ridge.  The area is tender to palpation.  She is complaining of blurry vision in the left eye but she has a normal red light reflex and normal pupillary response.  Her EOM is intact.  She denies striking her nose or having a bloody nose.  She can remember events of the incident without issue.  She reports that she is no longer having a headache and that responded with Tylenol .  Given her age and mechanism of injury I do feel that she needs a CT scan of her head and face to rule out any intracranial process or facial fractures.  We are unable to obtain a CT scan here in urgent care today so she will go to [facility].   Final Clinical Impressions(s) / UC Diagnoses   Final diagnoses:  Fall, initial encounter  Contusion of face, initial encounter  Injury of head, initial encounter     Discharge Instructions      Please go to [facility] to be evaluated for your head injury after your fall yesterday.  The address is [address] [place], [place].  [address]     ED Prescriptions   None    PDMP not reviewed this encounter.   Bernardino Ditch, NP 08/20/24 [phone]

## 2024-08-20 NOTE — ED Notes (Signed)
 Patient is being discharged from the Urgent Care and sent to the [facility] Emergency Department via private vehicle . Per Venetia Motto, NP, patient is in need of higher level of care due to Facial and Head Injury and Hypertension. Patient is aware and verbalizes understanding of plan of care.  Vitals:   08/20/24 0911  BP: (!) 185/94  Pulse: 87  Resp: 14  Temp: 98 F (36.7 C)  SpO2: 98%
